# Patient Record
Sex: Male | Born: 1945 | Race: White | Hispanic: No | Marital: Married | Smoking: Never smoker
Health system: Southern US, Community
[De-identification: ages and names within clinical notes are randomized; demographics above are authoritative.]

## PROBLEM LIST (undated history)

## (undated) DIAGNOSIS — G5600 Carpal tunnel syndrome, unspecified upper limb: Secondary | ICD-10-CM

## (undated) DIAGNOSIS — H9193 Unspecified hearing loss, bilateral: Secondary | ICD-10-CM

## (undated) DIAGNOSIS — R7303 Prediabetes: Secondary | ICD-10-CM

## (undated) DIAGNOSIS — R350 Frequency of micturition: Secondary | ICD-10-CM

## (undated) DIAGNOSIS — Z8619 Personal history of other infectious and parasitic diseases: Secondary | ICD-10-CM

## (undated) DIAGNOSIS — N400 Enlarged prostate without lower urinary tract symptoms: Secondary | ICD-10-CM

## (undated) DIAGNOSIS — I10 Essential (primary) hypertension: Secondary | ICD-10-CM

## (undated) DIAGNOSIS — M1990 Unspecified osteoarthritis, unspecified site: Secondary | ICD-10-CM

## (undated) DIAGNOSIS — Z8719 Personal history of other diseases of the digestive system: Secondary | ICD-10-CM

## (undated) DIAGNOSIS — N059 Unspecified nephritic syndrome with unspecified morphologic changes: Secondary | ICD-10-CM

## (undated) DIAGNOSIS — R351 Nocturia: Secondary | ICD-10-CM

## (undated) DIAGNOSIS — Z8709 Personal history of other diseases of the respiratory system: Secondary | ICD-10-CM

## (undated) DIAGNOSIS — N200 Calculus of kidney: Secondary | ICD-10-CM

## (undated) DIAGNOSIS — K219 Gastro-esophageal reflux disease without esophagitis: Secondary | ICD-10-CM

## (undated) HISTORY — DX: Prediabetes: R73.03

## (undated) HISTORY — DX: Unspecified hearing loss, bilateral: H91.93

## (undated) HISTORY — PX: OTHER SURGICAL HISTORY: SHX169

## (undated) HISTORY — PX: TONSILLECTOMY: SUR1361

## (undated) HISTORY — DX: Essential (primary) hypertension: I10

## (undated) HISTORY — PX: PROSTATE BIOPSY: SHX241

## (undated) HISTORY — DX: Calculus of kidney: N20.0

## (undated) HISTORY — DX: Benign prostatic hyperplasia without lower urinary tract symptoms: N40.0

---

## 2013-07-22 HISTORY — PX: HIP SURGERY: SHX245

## 2013-12-29 DIAGNOSIS — I10 Essential (primary) hypertension: Secondary | ICD-10-CM | POA: Diagnosis not present

## 2013-12-29 DIAGNOSIS — K219 Gastro-esophageal reflux disease without esophagitis: Secondary | ICD-10-CM | POA: Diagnosis not present

## 2013-12-29 DIAGNOSIS — E119 Type 2 diabetes mellitus without complications: Secondary | ICD-10-CM | POA: Diagnosis not present

## 2013-12-29 DIAGNOSIS — M109 Gout, unspecified: Secondary | ICD-10-CM | POA: Diagnosis not present

## 2013-12-29 DIAGNOSIS — E7800 Pure hypercholesterolemia, unspecified: Secondary | ICD-10-CM | POA: Diagnosis not present

## 2013-12-29 DIAGNOSIS — N200 Calculus of kidney: Secondary | ICD-10-CM | POA: Diagnosis not present

## 2014-03-08 DIAGNOSIS — N200 Calculus of kidney: Secondary | ICD-10-CM | POA: Diagnosis not present

## 2014-03-08 DIAGNOSIS — N402 Nodular prostate without lower urinary tract symptoms: Secondary | ICD-10-CM | POA: Diagnosis not present

## 2014-03-08 DIAGNOSIS — R972 Elevated prostate specific antigen [PSA]: Secondary | ICD-10-CM | POA: Diagnosis not present

## 2014-03-08 DIAGNOSIS — N201 Calculus of ureter: Secondary | ICD-10-CM | POA: Diagnosis not present

## 2014-03-08 DIAGNOSIS — N401 Enlarged prostate with lower urinary tract symptoms: Secondary | ICD-10-CM | POA: Diagnosis not present

## 2014-03-24 DIAGNOSIS — M1610 Unilateral primary osteoarthritis, unspecified hip: Secondary | ICD-10-CM | POA: Diagnosis not present

## 2014-03-24 DIAGNOSIS — M25559 Pain in unspecified hip: Secondary | ICD-10-CM | POA: Diagnosis not present

## 2014-03-24 DIAGNOSIS — Z96649 Presence of unspecified artificial hip joint: Secondary | ICD-10-CM | POA: Diagnosis not present

## 2014-03-25 DIAGNOSIS — N402 Nodular prostate without lower urinary tract symptoms: Secondary | ICD-10-CM | POA: Diagnosis not present

## 2014-03-25 DIAGNOSIS — N201 Calculus of ureter: Secondary | ICD-10-CM | POA: Diagnosis not present

## 2014-03-25 DIAGNOSIS — N401 Enlarged prostate with lower urinary tract symptoms: Secondary | ICD-10-CM | POA: Diagnosis not present

## 2014-03-25 DIAGNOSIS — N138 Other obstructive and reflux uropathy: Secondary | ICD-10-CM | POA: Diagnosis not present

## 2014-03-25 DIAGNOSIS — N200 Calculus of kidney: Secondary | ICD-10-CM | POA: Diagnosis not present

## 2014-04-21 DIAGNOSIS — N138 Other obstructive and reflux uropathy: Secondary | ICD-10-CM | POA: Diagnosis not present

## 2014-04-21 DIAGNOSIS — N200 Calculus of kidney: Secondary | ICD-10-CM | POA: Diagnosis not present

## 2014-04-21 DIAGNOSIS — N401 Enlarged prostate with lower urinary tract symptoms: Secondary | ICD-10-CM | POA: Diagnosis not present

## 2014-04-22 DIAGNOSIS — N402 Nodular prostate without lower urinary tract symptoms: Secondary | ICD-10-CM | POA: Diagnosis not present

## 2014-04-22 DIAGNOSIS — N200 Calculus of kidney: Secondary | ICD-10-CM | POA: Diagnosis not present

## 2014-04-22 DIAGNOSIS — N401 Enlarged prostate with lower urinary tract symptoms: Secondary | ICD-10-CM | POA: Diagnosis not present

## 2014-05-24 DIAGNOSIS — N200 Calculus of kidney: Secondary | ICD-10-CM | POA: Diagnosis not present

## 2014-05-24 DIAGNOSIS — N201 Calculus of ureter: Secondary | ICD-10-CM | POA: Diagnosis not present

## 2014-05-24 DIAGNOSIS — N402 Nodular prostate without lower urinary tract symptoms: Secondary | ICD-10-CM | POA: Diagnosis not present

## 2014-05-24 DIAGNOSIS — N401 Enlarged prostate with lower urinary tract symptoms: Secondary | ICD-10-CM | POA: Diagnosis not present

## 2014-06-08 DIAGNOSIS — N402 Nodular prostate without lower urinary tract symptoms: Secondary | ICD-10-CM | POA: Diagnosis not present

## 2014-06-08 DIAGNOSIS — R3129 Other microscopic hematuria: Secondary | ICD-10-CM | POA: Diagnosis not present

## 2014-06-08 DIAGNOSIS — N401 Enlarged prostate with lower urinary tract symptoms: Secondary | ICD-10-CM | POA: Diagnosis not present

## 2014-06-08 DIAGNOSIS — N138 Other obstructive and reflux uropathy: Secondary | ICD-10-CM | POA: Diagnosis not present

## 2014-06-08 DIAGNOSIS — N200 Calculus of kidney: Secondary | ICD-10-CM | POA: Diagnosis not present

## 2014-07-29 DIAGNOSIS — N200 Calculus of kidney: Secondary | ICD-10-CM | POA: Diagnosis not present

## 2014-08-04 DIAGNOSIS — E119 Type 2 diabetes mellitus without complications: Secondary | ICD-10-CM | POA: Diagnosis not present

## 2014-08-04 DIAGNOSIS — Z23 Encounter for immunization: Secondary | ICD-10-CM | POA: Diagnosis not present

## 2014-08-04 DIAGNOSIS — D649 Anemia, unspecified: Secondary | ICD-10-CM | POA: Diagnosis not present

## 2014-08-04 DIAGNOSIS — I10 Essential (primary) hypertension: Secondary | ICD-10-CM | POA: Diagnosis not present

## 2014-08-04 DIAGNOSIS — E782 Mixed hyperlipidemia: Secondary | ICD-10-CM | POA: Diagnosis not present

## 2014-08-04 DIAGNOSIS — E780 Pure hypercholesterolemia: Secondary | ICD-10-CM | POA: Diagnosis not present

## 2015-01-19 DIAGNOSIS — E119 Type 2 diabetes mellitus without complications: Secondary | ICD-10-CM | POA: Diagnosis not present

## 2015-01-19 DIAGNOSIS — I10 Essential (primary) hypertension: Secondary | ICD-10-CM | POA: Diagnosis not present

## 2015-01-19 DIAGNOSIS — D649 Anemia, unspecified: Secondary | ICD-10-CM | POA: Diagnosis not present

## 2015-01-19 DIAGNOSIS — E782 Mixed hyperlipidemia: Secondary | ICD-10-CM | POA: Diagnosis not present

## 2015-03-02 DIAGNOSIS — Z471 Aftercare following joint replacement surgery: Secondary | ICD-10-CM | POA: Diagnosis not present

## 2015-03-02 DIAGNOSIS — Z96642 Presence of left artificial hip joint: Secondary | ICD-10-CM | POA: Diagnosis not present

## 2015-03-02 DIAGNOSIS — M1611 Unilateral primary osteoarthritis, right hip: Secondary | ICD-10-CM | POA: Diagnosis not present

## 2015-04-12 DIAGNOSIS — M1990 Unspecified osteoarthritis, unspecified site: Secondary | ICD-10-CM | POA: Diagnosis not present

## 2015-04-12 DIAGNOSIS — I10 Essential (primary) hypertension: Secondary | ICD-10-CM | POA: Diagnosis not present

## 2015-04-12 DIAGNOSIS — K219 Gastro-esophageal reflux disease without esophagitis: Secondary | ICD-10-CM | POA: Diagnosis not present

## 2015-04-12 DIAGNOSIS — M109 Gout, unspecified: Secondary | ICD-10-CM | POA: Diagnosis not present

## 2015-04-12 DIAGNOSIS — Z96642 Presence of left artificial hip joint: Secondary | ICD-10-CM | POA: Diagnosis not present

## 2015-04-12 DIAGNOSIS — N400 Enlarged prostate without lower urinary tract symptoms: Secondary | ICD-10-CM | POA: Diagnosis not present

## 2015-04-12 DIAGNOSIS — R9431 Abnormal electrocardiogram [ECG] [EKG]: Secondary | ICD-10-CM | POA: Diagnosis not present

## 2015-04-12 DIAGNOSIS — G8929 Other chronic pain: Secondary | ICD-10-CM | POA: Diagnosis not present

## 2015-04-12 DIAGNOSIS — E119 Type 2 diabetes mellitus without complications: Secondary | ICD-10-CM | POA: Diagnosis not present

## 2015-04-12 DIAGNOSIS — N200 Calculus of kidney: Secondary | ICD-10-CM | POA: Diagnosis not present

## 2015-04-12 DIAGNOSIS — Z01818 Encounter for other preprocedural examination: Secondary | ICD-10-CM | POA: Diagnosis not present

## 2015-04-12 DIAGNOSIS — E785 Hyperlipidemia, unspecified: Secondary | ICD-10-CM | POA: Diagnosis not present

## 2015-04-13 ENCOUNTER — Ambulatory Visit (INDEPENDENT_AMBULATORY_CARE_PROVIDER_SITE_OTHER): Payer: Medicare Other | Admitting: Internal Medicine

## 2015-04-13 ENCOUNTER — Encounter: Payer: Self-pay | Admitting: Internal Medicine

## 2015-04-13 VITALS — BP 144/86 | HR 83 | Ht 67.0 in | Wt 228.8 lb

## 2015-04-13 DIAGNOSIS — R9431 Abnormal electrocardiogram [ECG] [EKG]: Secondary | ICD-10-CM

## 2015-04-13 NOTE — Progress Notes (Signed)
Cardiology Office Note   Date:  04/13/2015   ID:  Corey Fuller, DOB [birth year], MRN [id number]  PCP:  Redmond Baseman, MD  Cardiologist:   Dietrich Pates, MD   No chief complaint on file.  Pt presents for preop evaluation     History of Present Illness: Corey Fuller is a 69 y.o. male who presents for preop evaluation for hip surgery  Hx abnormal EKG    Hx of HTN, HL  And DM  On Metformin for 2 years  "borderline"  Denies SOB  No CP    Activity limited some because of hip   Pain though is worse when supine  Pt vacuums  Walks up and down stairs. Mows yard  Walk mower    Hip surgery Sept 10    Current Outpatient Prescriptions  Medication Sig Dispense Refill  . allopurinol (ZYLOPRIM) 300 MG tablet Take 300 mg by mouth daily.    Marland Kitchen amLODipine (NORVASC) 5 MG tablet Take 5 mg by mouth daily.    Marland Kitchen aspirin 81 MG tablet Take 81 mg by mouth daily.    Marland Kitchen dutasteride (AVODART) 0.5 MG capsule Take 0.5 mg by mouth daily.    . fenofibrate (TRICOR) 48 MG tablet Take 48 mg by mouth daily.    . fluticasone (VERAMYST) 27.5 MCG/SPRAY nasal spray Place 2 sprays into the nose daily.    . Garlic 1000 MG CAPS Take 1,000 mg by mouth daily.    Marland Kitchen losartan-hydrochlorothiazide (HYZAAR) 100-12.5 MG per tablet Take 1 tablet by mouth daily.    . metFORMIN (GLUCOPHAGE) 500 MG tablet Take by mouth 2 (two) times daily with a meal.    . Multiple Vitamin (MULTIVITAMIN) tablet Take 1 tablet by mouth daily.    . naproxen (NAPROSYN) 375 MG tablet Take 375 mg by mouth 2 (two) times daily as needed for mild pain.    Marland Kitchen omeprazole (PRILOSEC) 40 MG capsule Take 40 mg by mouth daily.    . rosuvastatin (CRESTOR) 40 MG tablet Take 40 mg by mouth daily.    . tamsulosin (FLOMAX) 0.4 MG CAPS capsule Take 0.4 mg by mouth.    . vitamin E 400 UNIT capsule Take 400 Units by mouth daily.     No current facility-administered medications for this visit.    Allergies:   Review of patient's allergies indicates no known allergies.    Past Medical History  Diagnosis Date  . Hypertension   . Kidney stone   . Enlarged prostate   . Borderline diabetes   . Hearing difficulty of both ears     Past Surgical History  Procedure Laterality Date  . Hip surgery  07/22/2013    Left hip     Social History:  The patient  reports that he has never smoked. He does not have any smokeless tobacco history on file. He reports that he does not drink alcohol or use illicit drugs.   Family History:  The patient's family history includes Alzheimer's disease in his mother; Heart attack in his father.    ROS:  Please see the history of present illness. All other systems are reviewed and  Negative to the above problem except as noted.    PHYSICAL EXAM: VS:  BP 144/86 mmHg  Pulse 83  Ht  (1.702 m)  Wt 228 lb 12.8 oz (103.783 kg)  BMI 35.83 kg/m2  GEN: Well nourished, well developed, in no acute distress HEENT: normal Neck: no JVD, carotid bruits, or masses Cardiac: RRR; no  murmurs, rubs, or gallops,no edema  Respiratory:  clear to auscultation bilaterally, normal work of breathing GI: soft, nontender, nondistended, + BS  No hepatomegaly  MS: no deformity Moving all extremities   Skin: warm and dry, no rash Neuro:  Strength and sensation are intact Psych: euthymic mood, full affect   EKG:  EKG is ordered today.  SR 93 bpm  RBBB.  LAD     Lipid Panel No results found for: CHOL, TRIG, HDL, CHOLHDL, VLDL, LDLCALC, LDLDIRECT    Wt Readings from Last 3 Encounters:  04/13/15 228 lb 12.8 oz (103.783 kg)      ASSESSMENT AND PLAN:  1.  Preop evaluation.  Pt is active despite DJD  He denies CP Exam unremarkable  EKG with SR but RBBB. I would recomm echo to confirm LVEF  Otherwise, if normal I think pt is low risk for major cardiac event and OK to proceed with surgery  2.  HTN  BP is a little high today  I would not make any changes today   Current medicines are reviewed at length with the patient today.  The  patient does not have concerns regarding medicines.  No follow up sched  WIll depend on echo   Signed, Dietrich PatesPaula Perrion Diesel, MD  04/13/2015 9:35 AM    [facility] [address], [place], [place]  [address] Phone: [phone]) [phone]; Fax: [phone]) [phone]

## 2015-04-13 NOTE — Patient Instructions (Addendum)
Your physician recommends that you continue on your current medications as directed. Please refer to the Current Medication list given to you today.  Your physician has requested that you have an echocardiogram. Echocardiography is a painless test that uses sound waves to create images of your heart. It provides your doctor with information about the size and shape of your heart and how well your heart's chambers and valves are working. This procedure takes approximately one hour. There are no restrictions for this procedure.  Your physician wants you to follow-up in: 1 YEAR WITH DR. Tenny CrawOSS.  You will receive a reminder letter in the mail two months in advance. If you don't receive a letter, please call our office to schedule the follow-up appointment.

## 2015-04-19 ENCOUNTER — Ambulatory Visit (HOSPITAL_COMMUNITY): Payer: Medicare Other | Attending: Internal Medicine

## 2015-04-19 ENCOUNTER — Other Ambulatory Visit: Payer: Self-pay

## 2015-04-19 ENCOUNTER — Telehealth: Payer: Self-pay | Admitting: Internal Medicine

## 2015-04-19 DIAGNOSIS — E669 Obesity, unspecified: Secondary | ICD-10-CM | POA: Diagnosis not present

## 2015-04-19 DIAGNOSIS — Z6835 Body mass index (BMI) 35.0-35.9, adult: Secondary | ICD-10-CM | POA: Insufficient documentation

## 2015-04-19 DIAGNOSIS — E119 Type 2 diabetes mellitus without complications: Secondary | ICD-10-CM | POA: Insufficient documentation

## 2015-04-19 DIAGNOSIS — E785 Hyperlipidemia, unspecified: Secondary | ICD-10-CM | POA: Insufficient documentation

## 2015-04-19 DIAGNOSIS — I10 Essential (primary) hypertension: Secondary | ICD-10-CM | POA: Insufficient documentation

## 2015-04-19 DIAGNOSIS — R9431 Abnormal electrocardiogram [ECG] [EKG]: Secondary | ICD-10-CM | POA: Diagnosis not present

## 2015-04-19 MED ORDER — PERFLUTREN LIPID MICROSPHERE
3.0000 mL | Freq: Once | INTRAVENOUS | Status: AC
  Administered 2015-04-19: 3 mL via INTRAVENOUS

## 2015-04-19 NOTE — Telephone Encounter (Signed)
Walk in pt. Form.,Medication is incorrect. [facility]

## 2015-04-21 ENCOUNTER — Telehealth: Payer: Self-pay | Admitting: *Deleted

## 2015-04-21 NOTE — Telephone Encounter (Signed)
Pt walked in with medication list, requesting we change how we have his metformin listed. He takes metformin 500 mg once daily Med list adjusted.

## 2015-06-13 ENCOUNTER — Ambulatory Visit: Payer: Self-pay | Admitting: Orthopedic Surgery

## 2015-06-13 NOTE — Progress Notes (Signed)
Preoperative surgical orders have been place into the Epic hospital system for [facility] on 06/13/2015, 7:34 AM  by Patrica Duel for surgery on 06-26-2015.  Preop Total Hip - Anterior Approach orders including IV Tylenol, and IV Decadron as long as there are no contraindications to the above medications. Avel Peace, PA-C

## 2015-06-19 ENCOUNTER — Encounter (HOSPITAL_COMMUNITY)
Admission: RE | Admit: 2015-06-19 | Discharge: 2015-06-19 | Disposition: A | Discharge status: Home / Self Care | Payer: Medicare Other | Source: Ambulatory Visit | Attending: Orthopedic Surgery | Admitting: Orthopedic Surgery

## 2015-06-19 ENCOUNTER — Encounter (INDEPENDENT_AMBULATORY_CARE_PROVIDER_SITE_OTHER): Payer: Self-pay

## 2015-06-19 ENCOUNTER — Encounter (HOSPITAL_COMMUNITY): Payer: Self-pay

## 2015-06-19 DIAGNOSIS — Z01818 Encounter for other preprocedural examination: Secondary | ICD-10-CM | POA: Diagnosis not present

## 2015-06-19 DIAGNOSIS — M1611 Unilateral primary osteoarthritis, right hip: Secondary | ICD-10-CM | POA: Insufficient documentation

## 2015-06-19 HISTORY — DX: Unspecified osteoarthritis, unspecified site: M19.90

## 2015-06-19 HISTORY — DX: Personal history of other infectious and parasitic diseases: Z86.19

## 2015-06-19 HISTORY — DX: Nocturia: R35.1

## 2015-06-19 HISTORY — DX: Personal history of other diseases of the digestive system: Z87.19

## 2015-06-19 HISTORY — DX: Gastro-esophageal reflux disease without esophagitis: K21.9

## 2015-06-19 HISTORY — DX: Frequency of micturition: R35.0

## 2015-06-19 HISTORY — DX: Unspecified nephritic syndrome with unspecified morphologic changes: N05.9

## 2015-06-19 HISTORY — DX: Personal history of other diseases of the respiratory system: Z87.09

## 2015-06-19 HISTORY — DX: Carpal tunnel syndrome, unspecified upper limb: G56.00

## 2015-06-19 LAB — COMPREHENSIVE METABOLIC PANEL
ALBUMIN: 4.5 g/dL (ref 3.5–5.0)
ALT: 20 U/L (ref 17–63)
ANION GAP: 10 (ref 5–15)
AST: 28 U/L (ref 15–41)
Alkaline Phosphatase: 62 U/L (ref 38–126)
BUN: 18 mg/dL (ref 6–20)
CHLORIDE: 103 mmol/L (ref 101–111)
CO2: 28 mmol/L (ref 22–32)
Calcium: 10.1 mg/dL (ref 8.9–10.3)
Creatinine, Ser: 1.3 mg/dL — ABNORMAL HIGH (ref 0.61–1.24)
GFR calc non Af Amer: 54 mL/min — ABNORMAL LOW (ref >60)
Glucose, Bld: 98 mg/dL (ref 65–99)
Potassium: 3.8 mmol/L (ref 3.5–5.1)
SODIUM: 141 mmol/L (ref 135–145)
Total Bilirubin: 0.7 mg/dL (ref 0.3–1.2)
Total Protein: 7.6 g/dL (ref 6.5–8.1)

## 2015-06-19 LAB — URINALYSIS, ROUTINE W REFLEX MICROSCOPIC
BILIRUBIN URINE: NEGATIVE
GLUCOSE, UA: NEGATIVE mg/dL
HGB URINE DIPSTICK: NEGATIVE
KETONES UR: NEGATIVE mg/dL
Nitrite: NEGATIVE
PROTEIN: NEGATIVE mg/dL
Specific Gravity, Urine: 1.02 (ref 1.005–1.030)
Urobilinogen, UA: 0.2 mg/dL (ref 0.0–1.0)
pH: 6.5 (ref 5.0–8.0)

## 2015-06-19 LAB — SURGICAL PCR SCREEN
MRSA, PCR: NEGATIVE
STAPHYLOCOCCUS AUREUS: NEGATIVE

## 2015-06-19 LAB — URINE MICROSCOPIC-ADD ON

## 2015-06-19 LAB — ABO/RH: ABO/RH(D): O POS

## 2015-06-19 LAB — CBC
HCT: 47.3 % (ref 39.0–52.0)
Hemoglobin: 15.7 g/dL (ref 13.0–17.0)
MCH: 31 pg (ref 26.0–34.0)
MCHC: 33.2 g/dL (ref 30.0–36.0)
MCV: 93.5 fL (ref 78.0–100.0)
PLATELETS: 235 10*3/uL (ref 150–400)
RBC: 5.06 MIL/uL (ref 4.22–5.81)
RDW: 13.6 % (ref 11.5–15.5)
WBC: 6.6 10*3/uL (ref 4.0–10.5)

## 2015-06-19 LAB — APTT: APTT: 32 s (ref 24–37)

## 2015-06-19 LAB — PROTIME-INR
INR: 1.06 (ref 0.00–1.49)
Prothrombin Time: 14 seconds (ref 11.6–15.2)

## 2015-06-19 NOTE — Progress Notes (Signed)
Your patient has screened at an elevated risk for Obstructive Sleep Apnea using the Stop-Bang Tool during a pre-surgical visit. Pt demonstrated high risk. Score of 6.

## 2015-06-19 NOTE — Progress Notes (Signed)
Pt notified of surgical time change. Pt aware to disregard clear liquid diet instructions given during PAT visit 06/19/2015. Pt aware to not eat any food or drink any liquids after midnight. Pt aware to arrive at short stay at 9:40 am.

## 2015-06-19 NOTE — Progress Notes (Signed)
Note per chart per Dr Modesto Charon 04/12/2015  EKG epic 04/13/2015 ECHO epic 04/19/2015 noting clearance per Dr Tenny Craw / cardiology  Dr Clarise Cruz notes / epic 04/13/2015 and 04/19/2015

## 2015-06-19 NOTE — Patient Instructions (Signed)
Corey Fuller  06/19/2015   Your procedure is scheduled on: Monday June 26, 2015   Report to [facility] Main  Entrance take [place] [facility]  elevators to 3rd floor to  Short Stay Center at 10:40 AM.  Call this number if you have problems the morning of surgery [phone]   Remember: ONLY 1 PERSON MAY GO WITH YOU TO SHORT STAY TO GET  READY MORNING OF YOUR SURGERY.  Do not eat food After Midnight but may take clear liquids till 7:30 am day of surgery then nothing by mouth.      Take these medicines the morning of surgery with A SIP OF WATER: Allopurinol; Amlodipine (Norvasc); Omeprazole (Prilosec); Oxycodone-Acetaminophen if needed                               You may not have any metal on your body including hair pins and              piercings  Do not wear jewelry, lotions, powders or colognes, deodorant                           Men may shave face and neck.   Do not bring valuables to the hospital. [facility] IS NOT             RESPONSIBLE   FOR VALUABLES.  Contacts, dentures or bridgework may not be worn into surgery.  Leave suitcase in the car. After surgery it may be brought to your room.                Please read over the following fact sheets you were given:MRSA INFORMATION SHEET; INCENTIVE SPIROMETER; BLOOD TRANSFUSION INFORMATION SHEET _____________________________________________________________________             [facility] - Preparing for Surgery Before surgery, you can play an important role.  Because skin is not sterile, your skin needs to be as free of germs as possible.  You can reduce the number of germs on your skin by washing with CHG (chlorahexidine gluconate) soap before surgery.  CHG is an antiseptic cleaner which kills germs and bonds with the skin to continue killing germs even after washing. Please DO NOT use if you have an allergy to CHG or antibacterial soaps.  If your skin becomes reddened/irritated stop using the CHG and inform your  nurse when you arrive at Short Stay. Do not shave (including legs and underarms) for at least 48 hours prior to the first CHG shower.  You may shave your face/neck. Please follow these instructions carefully:  1.  Shower with CHG Soap the night before surgery and the  morning of Surgery.  2.  If you choose to wash your hair, wash your hair first as usual with your  normal  shampoo.  3.  After you shampoo, rinse your hair and body thoroughly to remove the  shampoo.                           4.  Use CHG as you would any other liquid soap.  You can apply chg directly  to the skin and wash                       Gently with a  scrungie or clean washcloth.  5.  Apply the CHG Soap to your body ONLY FROM THE NECK DOWN.   Do not use on face/ open                           Wound or open sores. Avoid contact with eyes, ears mouth and genitals (private parts).                       Wash face,  Genitals (private parts) with your normal soap.             6.  Wash thoroughly, paying special attention to the area where your surgery  will be performed.  7.  Thoroughly rinse your body with warm water from the neck down.  8.  DO NOT shower/wash with your normal soap after using and rinsing off  the CHG Soap.                9.  Pat yourself dry with a clean towel.            10.  Wear clean pajamas.            11.  Place clean sheets on your bed the night of your first shower and do not  sleep with pets. Day of Surgery : Do not apply any lotions/deodorants the morning of surgery.  Please wear clean clothes to the hospital/surgery center.  FAILURE TO FOLLOW THESE INSTRUCTIONS MAY RESULT IN THE CANCELLATION OF YOUR SURGERY PATIENT SIGNATURE_________________________________  NURSE SIGNATURE__________________________________  ________________________________________________________________________    CLEAR LIQUID DIET   Foods Allowed                                                                     Foods  Excluded  Coffee and tea, regular and decaf                             liquids that you cannot  Plain Jell-O in any flavor                                             see through such as: Fruit ices (not with fruit pulp)                                     milk, soups, orange juice  Iced Popsicles                                    All solid food Carbonated beverages, regular and diet                                    Cranberry, grape and apple juices Sports drinks like Gatorade Lightly seasoned clear broth or consume(fat free) Sugar, honey syrup  Sample Menu Breakfast  Lunch                                     Supper Cranberry juice                    Beef broth                            Chicken broth Jell-O                                     Grape juice                           Apple juice Coffee or tea                        Jell-O                                      Popsicle                                                Coffee or tea                        Coffee or tea  _____________________________________________________________________    Incentive Spirometer  An incentive spirometer is a tool that can help keep your lungs clear and active. This tool measures how well you are filling your lungs with each breath. Taking long deep breaths may help reverse or decrease the chance of developing breathing (pulmonary) problems (especially infection) following:  A long period of time when you are unable to move or be active. BEFORE THE PROCEDURE   If the spirometer includes an indicator to show your best effort, your nurse or respiratory therapist will set it to a desired goal.  If possible, sit up straight or lean slightly forward. Try not to slouch.  Hold the incentive spirometer in an upright position. INSTRUCTIONS FOR USE   Sit on the edge of your bed if possible, or sit up as far as you can in bed or on a chair.  Hold the incentive  spirometer in an upright position.  Breathe out normally.  Place the mouthpiece in your mouth and seal your lips tightly around it.  Breathe in slowly and as deeply as possible, raising the piston or the ball toward the top of the column.  Hold your breath for 3-5 seconds or for as long as possible. Allow the piston or ball to fall to the bottom of the column.  Remove the mouthpiece from your mouth and breathe out normally.  Rest for a few seconds and repeat Steps 1 through 7 at least 10 times every 1-2 hours when you are awake. Take your time and take a few normal breaths between deep breaths.  The spirometer may include an indicator to show your best effort. Use the indicator as a goal to work toward during each repetition.  After each set of 10 deep breaths,  practice coughing to be sure your lungs are clear. If you have an incision (the cut made at the time of surgery), support your incision when coughing by placing a pillow or rolled up towels firmly against it. Once you are able to get out of bed, walk around indoors and cough well. You may stop using the incentive spirometer when instructed by your caregiver.  RISKS AND COMPLICATIONS  Take your time so you do not get dizzy or light-headed.  If you are in pain, you may need to take or ask for pain medication before doing incentive spirometry. It is harder to take a deep breath if you are having pain. AFTER USE  Rest and breathe slowly and easily.  It can be helpful to keep track of a log of your progress. Your caregiver can provide you with a simple table to help with this. If you are using the spirometer at home, follow these instructions: SEEK MEDICAL CARE IF:   You are having difficultly using the spirometer.  You have trouble using the spirometer as often as instructed.  Your pain medication is not giving enough relief while using the spirometer.  You develop fever of 100.5 F (38.1 C) or higher. SEEK IMMEDIATE MEDICAL  CARE IF:   You cough up bloody sputum that had not been present before.  You develop fever of 102 F (38.9 C) or greater.  You develop worsening pain at or near the incision site. MAKE SURE YOU:   Understand these instructions.  Will watch your condition.  Will get help right away if you are not doing well or get worse. Document Released: 02/03/2007 Document Revised: 12/16/2011 Document Reviewed: 04/06/2007 ExitCare Patient Information 2014 ExitCare, [place].   ________________________________________________________________________  WHAT IS A BLOOD TRANSFUSION? Blood Transfusion Information  A transfusion is the replacement of blood or some of its parts. Blood is made up of multiple cells which provide different functions.  Red blood cells carry oxygen and are used for blood loss replacement.  White blood cells fight against infection.  Platelets control bleeding.  Plasma helps clot blood.  Other blood products are available for specialized needs, such as hemophilia or other clotting disorders. BEFORE THE TRANSFUSION  Who gives blood for transfusions?   Healthy volunteers who are fully evaluated to make sure their blood is safe. This is blood bank blood. Transfusion therapy is the safest it has ever been in the practice of medicine. Before blood is taken from a donor, a complete history is taken to make sure that person has no history of diseases nor engages in risky social behavior (examples are intravenous drug use or sexual activity with multiple partners). The donor's travel history is screened to minimize risk of transmitting infections, such as malaria. The donated blood is tested for signs of infectious diseases, such as HIV and hepatitis. The blood is then tested to be sure it is compatible with you in order to minimize the chance of a transfusion reaction. If you or a relative donates blood, this is often done in anticipation of surgery and is not appropriate for  emergency situations. It takes many days to process the donated blood. RISKS AND COMPLICATIONS Although transfusion therapy is very safe and saves many lives, the main dangers of transfusion include:   Getting an infectious disease.  Developing a transfusion reaction. This is an allergic reaction to something in the blood you were given. Every precaution is taken to prevent this. The decision to have a blood transfusion has been  considered carefully by your caregiver before blood is given. Blood is not given unless the benefits outweigh the risks. AFTER THE TRANSFUSION  Right after receiving a blood transfusion, you will usually feel much better and more energetic. This is especially true if your red blood cells have gotten low (anemic). The transfusion raises the level of the red blood cells which carry oxygen, and this usually causes an energy increase.  The nurse administering the transfusion will monitor you carefully for complications. HOME CARE INSTRUCTIONS  No special instructions are needed after a transfusion. You may find your energy is better. Speak with your caregiver about any limitations on activity for underlying diseases you may have. SEEK MEDICAL CARE IF:   Your condition is not improving after your transfusion.  You develop redness or irritation at the intravenous (IV) site. SEEK IMMEDIATE MEDICAL CARE IF:  Any of the following symptoms occur over the next 12 hours:  Shaking chills.  You have a temperature by mouth above 102 F (38.9 C), not controlled by medicine.  Chest, back, or muscle pain.  People around you feel you are not acting correctly or are confused.  Shortness of breath or difficulty breathing.  Dizziness and fainting.  You get a rash or develop hives.  You have a decrease in urine output.  Your urine turns a dark color or changes to pink, red, or brown. Any of the following symptoms occur over the next 10 days:  You have a temperature by  mouth above 102 F (38.9 C), not controlled by medicine.  Shortness of breath.  Weakness after normal activity.  The white part of the eye turns yellow (jaundice).  You have a decrease in the amount of urine or are urinating less often.  Your urine turns a dark color or changes to pink, red, or brown. Document Released: 09/20/2000 Document Revised: 12/16/2011 Document Reviewed: 05/09/2008 [facility] Patient Information 2014 [place], [place].  _______________________________________________________________________

## 2015-06-25 NOTE — H&P (Signed)
TOTAL HIP ADMISSION H&P  Patient is admitted for right total hip arthroplasty.  Subjective:  Chief Complaint: right hip pain  HPI: Corey Fuller, 69 y.o. male, has a history of pain and functional disability in the right hip(s) due to arthritis and patient has failed non-surgical conservative treatments for greater than 12 weeks to include NSAID's and/or analgesics, corticosteriod injections and activity modification.  Onset of symptoms was gradual starting 3 years ago with gradually worsening course since that time.The patient noted no past surgery on the right hip(s).  Patient currently rates pain in the right hip at 7 out of 10 with activity. Patient has night pain, worsening of pain with activity and weight bearing, pain that interfers with activities of daily living and pain with passive range of motion. Patient has evidence of subchondral cysts, subchondral sclerosis, periarticular osteophytes and joint space narrowing by imaging studies. This condition presents safety issues increasing the risk of falls.  There is no current active infection.   Past Medical History  Diagnosis Date  . Hypertension   . Kidney stone   . Enlarged prostate   . Borderline diabetes   . Hearing difficulty of both ears     can not hear high freq  . History of bronchitis     3 to 4 years ago   . Urinary frequency   . Nocturia   . GERD (gastroesophageal reflux disease)   . History of hiatal hernia   . Arthritis   . Carpal tunnel syndrome     bilat   . History of shingles     2 years ago   . Bright's disease     Past Surgical History  Procedure Laterality Date  . Hip surgery  07/22/2013    Left hip  . Tonsillectomy    . Cyst removed       on spine at age 57   . Prostate biopsy      times 3      Current outpatient prescriptions:  .  allopurinol (ZYLOPRIM) 300 MG tablet, Take 300 mg by mouth daily., Disp: , Rfl:  .  amLODipine (NORVASC) 5 MG tablet, Take 5 mg by mouth every morning. , Disp: ,  Rfl:  .  aspirin 81 MG tablet, Take 81 mg by mouth at bedtime. , Disp: , Rfl:  .  dutasteride (AVODART) 0.5 MG capsule, Take 0.5 mg by mouth daily., Disp: , Rfl:  .  fenofibrate (TRICOR) 48 MG tablet, Take 48 mg by mouth daily., Disp: , Rfl:  .  fluticasone (VERAMYST) 27.5 MCG/SPRAY nasal spray, Place 2 sprays into the nose at bedtime. , Disp: , Rfl:  .  Garlic 1000 MG CAPS, Take 1,000 mg by mouth daily., Disp: , Rfl:  .  losartan-hydrochlorothiazide (HYZAAR) 100-12.5 MG per tablet, Take 1 tablet by mouth every morning. , Disp: , Rfl:  .  metFORMIN (GLUCOPHAGE) 500 MG tablet, Take by mouth daily with breakfast. , Disp: , Rfl:  .  Multiple Vitamin (MULTIVITAMIN) tablet, Take 1 tablet by mouth daily., Disp: , Rfl:  .  naproxen (NAPROSYN) 375 MG tablet, Take 375 mg by mouth 2 (two) times daily as needed for mild pain., Disp: , Rfl:  .  omeprazole (PRILOSEC) 40 MG capsule, Take 40 mg by mouth daily., Disp: , Rfl:  .  oxyCODONE-acetaminophen (PERCOCET/ROXICET) 5-325 MG per tablet, Take 1 tablet by mouth 2 (two) times daily as needed for moderate pain or severe pain., Disp: , Rfl:  .  rosuvastatin (CRESTOR) 40  MG tablet, Take 40 mg by mouth every evening. , Disp: , Rfl:  .  tamsulosin (FLOMAX) 0.4 MG CAPS capsule, Take 0.4 mg by mouth every evening. , Disp: , Rfl:  .  vitamin E 400 UNIT capsule, Take 400 Units by mouth daily., Disp: , Rfl:   No Known Allergies  Social History  Substance Use Topics  . Smoking status: Never Smoker   . Smokeless tobacco: Never Used  . Alcohol Use: No    Family History  Problem Relation Age of Onset  . Alzheimer's disease Mother   . Heart attack Father      Review of Systems  Constitutional: Negative.   HENT: Positive for hearing loss. Negative for congestion, ear discharge, ear pain, nosebleeds, sore throat and tinnitus.   Eyes: Negative.   Respiratory: Negative.  Negative for stridor.   Cardiovascular: Negative.   Gastrointestinal: Negative.    Genitourinary: Positive for frequency. Negative for dysuria, urgency, hematuria and flank pain.  Musculoskeletal: Positive for joint pain. Negative for myalgias, back pain, falls and neck pain.       Right hip pain  Skin: Negative.   Neurological: Negative.  Negative for headaches.  Endo/Heme/Allergies: Negative.   Psychiatric/Behavioral: Negative.     Objective:  Physical Exam  Constitutional: He is oriented to person, place, and time. He appears well-developed. No distress.  Obese  HENT:  Head: Normocephalic and atraumatic.  Right Ear: External ear normal.  Left Ear: External ear normal.  Nose: Nose normal.  Mouth/Throat: Oropharynx is clear and moist.  Eyes: Conjunctivae and EOM are normal.  Neck: Normal range of motion. Neck supple.  Cardiovascular: Normal rate, regular rhythm, normal heart sounds and intact distal pulses.   No murmur heard. Respiratory: Effort normal and breath sounds normal. No respiratory distress. He has no wheezes.  GI: Soft. Bowel sounds are normal. He exhibits no distension. There is no tenderness.  Musculoskeletal:  Evaluation of his left hip, flexion 110, rotation 130, out forward abduction 40 without pain on range of motion. Right hip flexion 90. No internal rotation. About 5 degrees of external rotation, 5 to 10 degrees of abduction. His right knee exam is normal.  He has a significantly antalgic gait pattern on the right.  Neurological: He is alert and oriented to person, place, and time. He has normal strength and normal reflexes. No sensory deficit.  Skin: No rash noted. He is not diaphoretic. No erythema.  Psychiatric: He has a normal mood and affect. His behavior is normal.    Vitals  Weight: 229 lb Height: 66in Body Surface Area: 2.12 m Body Mass Index: 36.96 kg/m  Pulse: 88 (Regular)  BP: 130/84 (Sitting, Left Arm, Standard)  Imaging Review Plain radiographs demonstrate severe degenerative joint disease of the right hip(s).  The bone quality appears to be good for age and reported activity level.  Assessment/Plan:  End stage primary osteoarthritis, right hip(s)  The patient history, physical examination, clinical judgement of the provider and imaging studies are consistent with end stage degenerative joint disease of the right hip(s) and total hip arthroplasty is deemed medically necessary. The treatment options including medical management, injection therapy, arthroscopy and arthroplasty were discussed at length. The risks and benefits of total hip arthroplasty were presented and reviewed. The risks due to aseptic loosening, infection, stiffness, dislocation/subluxation,  thromboembolic complications and other imponderables were discussed.  The patient acknowledged the explanation, agreed to proceed with the plan and consent was signed. Patient is being admitted for inpatient treatment for  surgery, pain control, PT, OT, prophylactic antibiotics, VTE prophylaxis, progressive ambulation and ADL's and discharge planning.The patient is planning to be discharged  home with home health services     PCP:Dr. Modesto Charon Had appointment with Dr. Tenny Craw preop for abnormal EKG but echo was normal TXA IV   Dimitri Ped, PA-C

## 2015-06-26 ENCOUNTER — Inpatient Hospital Stay (HOSPITAL_COMMUNITY): Payer: Medicare Other | Admitting: Anesthesiology

## 2015-06-26 ENCOUNTER — Encounter (HOSPITAL_COMMUNITY)
Admission: RE | Disposition: A | Discharge status: Home Health | Payer: Self-pay | Source: Ambulatory Visit | Attending: Orthopedic Surgery

## 2015-06-26 ENCOUNTER — Inpatient Hospital Stay (HOSPITAL_COMMUNITY)
Admission: RE | Admit: 2015-06-26 | Discharge: 2015-06-27 | DRG: 470 | Disposition: A | Discharge status: Home Health | Payer: Medicare Other | Source: Ambulatory Visit | Attending: Orthopedic Surgery | Admitting: Orthopedic Surgery

## 2015-06-26 ENCOUNTER — Encounter (HOSPITAL_COMMUNITY): Payer: Self-pay | Admitting: *Deleted

## 2015-06-26 ENCOUNTER — Inpatient Hospital Stay (HOSPITAL_COMMUNITY): Payer: Medicare Other

## 2015-06-26 DIAGNOSIS — Z7982 Long term (current) use of aspirin: Secondary | ICD-10-CM | POA: Diagnosis not present

## 2015-06-26 DIAGNOSIS — H918X3 Other specified hearing loss, bilateral: Secondary | ICD-10-CM | POA: Diagnosis present

## 2015-06-26 DIAGNOSIS — Z8619 Personal history of other infectious and parasitic diseases: Secondary | ICD-10-CM | POA: Diagnosis not present

## 2015-06-26 DIAGNOSIS — M1611 Unilateral primary osteoarthritis, right hip: Secondary | ICD-10-CM | POA: Diagnosis not present

## 2015-06-26 DIAGNOSIS — M25551 Pain in right hip: Secondary | ICD-10-CM | POA: Diagnosis not present

## 2015-06-26 DIAGNOSIS — Z87442 Personal history of urinary calculi: Secondary | ICD-10-CM | POA: Diagnosis not present

## 2015-06-26 DIAGNOSIS — Z96649 Presence of unspecified artificial hip joint: Secondary | ICD-10-CM

## 2015-06-26 DIAGNOSIS — I10 Essential (primary) hypertension: Secondary | ICD-10-CM | POA: Diagnosis present

## 2015-06-26 DIAGNOSIS — Z01812 Encounter for preprocedural laboratory examination: Secondary | ICD-10-CM | POA: Diagnosis not present

## 2015-06-26 DIAGNOSIS — Z8249 Family history of ischemic heart disease and other diseases of the circulatory system: Secondary | ICD-10-CM

## 2015-06-26 DIAGNOSIS — N189 Chronic kidney disease, unspecified: Secondary | ICD-10-CM | POA: Diagnosis not present

## 2015-06-26 DIAGNOSIS — M169 Osteoarthritis of hip, unspecified: Secondary | ICD-10-CM | POA: Diagnosis present

## 2015-06-26 DIAGNOSIS — Z96642 Presence of left artificial hip joint: Secondary | ICD-10-CM | POA: Diagnosis present

## 2015-06-26 DIAGNOSIS — Z79899 Other long term (current) drug therapy: Secondary | ICD-10-CM | POA: Diagnosis not present

## 2015-06-26 DIAGNOSIS — Z471 Aftercare following joint replacement surgery: Secondary | ICD-10-CM | POA: Diagnosis not present

## 2015-06-26 DIAGNOSIS — K219 Gastro-esophageal reflux disease without esophagitis: Secondary | ICD-10-CM | POA: Diagnosis present

## 2015-06-26 DIAGNOSIS — Z96641 Presence of right artificial hip joint: Secondary | ICD-10-CM | POA: Diagnosis not present

## 2015-06-26 HISTORY — PX: TOTAL HIP ARTHROPLASTY: SHX124

## 2015-06-26 LAB — GLUCOSE, CAPILLARY
GLUCOSE-CAPILLARY: 96 mg/dL (ref 65–99)
GLUCOSE-CAPILLARY: 98 mg/dL (ref 65–99)
Glucose-Capillary: 223 mg/dL — ABNORMAL HIGH (ref 65–99)
Glucose-Capillary: 187 mg/dL — ABNORMAL HIGH (ref 65–99)

## 2015-06-26 LAB — TYPE AND SCREEN
ABO/RH(D): O POS
ANTIBODY SCREEN: NEGATIVE

## 2015-06-26 SURGERY — ARTHROPLASTY, HIP, TOTAL, ANTERIOR APPROACH
Anesthesia: Spinal | Site: Hip | Laterality: Right

## 2015-06-26 MED ORDER — BUPIVACAINE HCL (PF) 0.25 % IJ SOLN
INTRAMUSCULAR | Status: DC | PRN
  Administered 2015-06-26: 30 mL

## 2015-06-26 MED ORDER — RIVAROXABAN 10 MG PO TABS
10.0000 mg | ORAL_TABLET | Freq: Every day | ORAL | Status: DC
  Administered 2015-06-27: 10 mg via ORAL
  Filled 2015-06-26 (×2): qty 1

## 2015-06-26 MED ORDER — DIPHENHYDRAMINE HCL 12.5 MG/5ML PO ELIX: 12.5000 mg | ORAL_SOLUTION | ORAL | Status: DC | PRN

## 2015-06-26 MED ORDER — CEFAZOLIN SODIUM-DEXTROSE 2-3 GM-% IV SOLR
2.0000 g | Freq: Four times a day (QID) | INTRAVENOUS | Status: AC
  Administered 2015-06-26 (×2): 2 g via INTRAVENOUS
  Filled 2015-06-26 (×2): qty 50

## 2015-06-26 MED ORDER — PROPOFOL INFUSION 10 MG/ML OPTIME
INTRAVENOUS | Status: DC | PRN
  Administered 2015-06-26: 50 ug/kg/min via INTRAVENOUS

## 2015-06-26 MED ORDER — PROPOFOL 10 MG/ML IV BOLUS
INTRAVENOUS | Status: AC
  Filled 2015-06-26: qty 20

## 2015-06-26 MED ORDER — DUTASTERIDE 0.5 MG PO CAPS
0.5000 mg | ORAL_CAPSULE | Freq: Every day | ORAL | Status: DC
  Administered 2015-06-27: 0.5 mg via ORAL
  Filled 2015-06-26: qty 1

## 2015-06-26 MED ORDER — FLEET ENEMA 7-19 GM/118ML RE ENEM: 1.0000 | ENEMA | Freq: Once | RECTAL | Status: DC | PRN

## 2015-06-26 MED ORDER — TAMSULOSIN HCL 0.4 MG PO CAPS
0.4000 mg | ORAL_CAPSULE | Freq: Every evening | ORAL | Status: DC
  Administered 2015-06-26: 0.4 mg via ORAL
  Filled 2015-06-26 (×2): qty 1

## 2015-06-26 MED ORDER — CEFAZOLIN SODIUM-DEXTROSE 2-3 GM-% IV SOLR
2.0000 g | INTRAVENOUS | Status: AC
  Administered 2015-06-26: 2 g via INTRAVENOUS

## 2015-06-26 MED ORDER — DEXTROSE 5 % IV SOLN
10.0000 mg | INTRAVENOUS | Status: DC | PRN
  Administered 2015-06-26: 25 ug/min via INTRAVENOUS

## 2015-06-26 MED ORDER — INSULIN ASPART 100 UNIT/ML ~~LOC~~ SOLN
0.0000 [IU] | Freq: Three times a day (TID) | SUBCUTANEOUS | Status: DC
  Administered 2015-06-26: 5 [IU] via SUBCUTANEOUS

## 2015-06-26 MED ORDER — SODIUM CHLORIDE 0.9 % IV SOLN: INTRAVENOUS | Status: DC

## 2015-06-26 MED ORDER — FENTANYL CITRATE (PF) 100 MCG/2ML IJ SOLN
INTRAMUSCULAR | Status: DC | PRN
  Administered 2015-06-26: 50 ug via INTRAVENOUS

## 2015-06-26 MED ORDER — ONDANSETRON HCL 4 MG/2ML IJ SOLN: 4.0000 mg | Freq: Once | INTRAMUSCULAR | Status: DC | PRN

## 2015-06-26 MED ORDER — PANTOPRAZOLE SODIUM 40 MG PO TBEC
80.0000 mg | DELAYED_RELEASE_TABLET | Freq: Every day | ORAL | Status: DC
  Filled 2015-06-26: qty 2

## 2015-06-26 MED ORDER — MORPHINE SULFATE (PF) 2 MG/ML IV SOLN: 1.0000 mg | INTRAVENOUS | Status: DC | PRN

## 2015-06-26 MED ORDER — MIDAZOLAM HCL 2 MG/2ML IJ SOLN
INTRAMUSCULAR | Status: AC
  Filled 2015-06-26: qty 4

## 2015-06-26 MED ORDER — ONDANSETRON HCL 4 MG/2ML IJ SOLN
INTRAMUSCULAR | Status: AC
  Filled 2015-06-26: qty 2

## 2015-06-26 MED ORDER — FENTANYL CITRATE (PF) 100 MCG/2ML IJ SOLN
INTRAMUSCULAR | Status: AC
  Filled 2015-06-26: qty 4

## 2015-06-26 MED ORDER — FLUTICASONE PROPIONATE 50 MCG/ACT NA SUSP
1.0000 | Freq: Every day | NASAL | Status: DC
  Administered 2015-06-26: 1 via NASAL
  Filled 2015-06-26: qty 16

## 2015-06-26 MED ORDER — LOSARTAN POTASSIUM 50 MG PO TABS
100.0000 mg | ORAL_TABLET | Freq: Every day | ORAL | Status: DC
Start: 2015-06-27 — End: 2015-06-27
  Administered 2015-06-27: 100 mg via ORAL
  Filled 2015-06-26: qty 2

## 2015-06-26 MED ORDER — PHENOL 1.4 % MT LIQD: 1.0000 | OROMUCOSAL | Status: DC | PRN

## 2015-06-26 MED ORDER — TRANEXAMIC ACID 1000 MG/10ML IV SOLN
1000.0000 mg | INTRAVENOUS | Status: AC
  Administered 2015-06-26: 1000 mg via INTRAVENOUS
  Filled 2015-06-26: qty 10

## 2015-06-26 MED ORDER — METFORMIN HCL 500 MG PO TABS
500.0000 mg | ORAL_TABLET | Freq: Every day | ORAL | Status: DC
  Administered 2015-06-27: 500 mg via ORAL
  Filled 2015-06-26 (×2): qty 1

## 2015-06-26 MED ORDER — OXYCODONE HCL 5 MG PO TABS: 5.0000 mg | ORAL_TABLET | Freq: Once | ORAL | Status: DC | PRN

## 2015-06-26 MED ORDER — METHOCARBAMOL 1000 MG/10ML IJ SOLN
500.0000 mg | Freq: Four times a day (QID) | INTRAVENOUS | Status: DC | PRN
  Administered 2015-06-26: 500 mg via INTRAVENOUS
  Filled 2015-06-26 (×2): qty 5

## 2015-06-26 MED ORDER — LACTATED RINGERS IV SOLN
INTRAVENOUS | Status: DC
  Administered 2015-06-26: 1000 mL via INTRAVENOUS

## 2015-06-26 MED ORDER — OXYCODONE HCL 5 MG/5ML PO SOLN
5.0000 mg | Freq: Once | ORAL | Status: DC | PRN
  Filled 2015-06-26: qty 5

## 2015-06-26 MED ORDER — TRAMADOL HCL 50 MG PO TABS
50.0000 mg | ORAL_TABLET | Freq: Four times a day (QID) | ORAL | Status: DC | PRN
Start: 2015-06-26 — End: 2015-06-27

## 2015-06-26 MED ORDER — AMLODIPINE BESYLATE 5 MG PO TABS
5.0000 mg | ORAL_TABLET | Freq: Every morning | ORAL | Status: DC
  Administered 2015-06-27: 5 mg via ORAL
  Filled 2015-06-26: qty 1

## 2015-06-26 MED ORDER — HYDROCHLOROTHIAZIDE 12.5 MG PO CAPS
12.5000 mg | ORAL_CAPSULE | Freq: Every day | ORAL | Status: DC
  Administered 2015-06-27: 12.5 mg via ORAL
  Filled 2015-06-26: qty 1

## 2015-06-26 MED ORDER — DEXAMETHASONE SODIUM PHOSPHATE 10 MG/ML IJ SOLN
10.0000 mg | Freq: Once | INTRAMUSCULAR | Status: AC
  Administered 2015-06-26: 10 mg via INTRAVENOUS

## 2015-06-26 MED ORDER — ACETAMINOPHEN 325 MG PO TABS: 650.0000 mg | ORAL_TABLET | Freq: Four times a day (QID) | ORAL | Status: DC | PRN

## 2015-06-26 MED ORDER — POTASSIUM CHLORIDE IN NACL 20-0.9 MEQ/L-% IV SOLN
INTRAVENOUS | Status: DC
  Administered 2015-06-26: 15:00:00 via INTRAVENOUS
  Filled 2015-06-26 (×2): qty 1000

## 2015-06-26 MED ORDER — 0.9 % SODIUM CHLORIDE (POUR BTL) OPTIME
TOPICAL | Status: DC | PRN
  Administered 2015-06-26: 1000 mL

## 2015-06-26 MED ORDER — ROSUVASTATIN CALCIUM 40 MG PO TABS
40.0000 mg | ORAL_TABLET | Freq: Every evening | ORAL | Status: DC
  Administered 2015-06-26: 40 mg via ORAL
  Filled 2015-06-26 (×2): qty 1

## 2015-06-26 MED ORDER — BISACODYL 10 MG RE SUPP: 10.0000 mg | Freq: Every day | RECTAL | Status: DC | PRN

## 2015-06-26 MED ORDER — CHLORHEXIDINE GLUCONATE 4 % EX LIQD: 60.0000 mL | Freq: Once | CUTANEOUS | Status: DC

## 2015-06-26 MED ORDER — ACETAMINOPHEN 10 MG/ML IV SOLN
INTRAVENOUS | Status: AC
  Filled 2015-06-26: qty 100

## 2015-06-26 MED ORDER — LOSARTAN POTASSIUM-HCTZ 100-12.5 MG PO TABS: 1.0000 | ORAL_TABLET | Freq: Every morning | ORAL | Status: DC

## 2015-06-26 MED ORDER — HYDROMORPHONE HCL 1 MG/ML IJ SOLN
0.2500 mg | INTRAMUSCULAR | Status: DC | PRN
  Administered 2015-06-26 (×2): 0.5 mg via INTRAVENOUS

## 2015-06-26 MED ORDER — CEFAZOLIN SODIUM-DEXTROSE 2-3 GM-% IV SOLR
INTRAVENOUS | Status: AC
  Filled 2015-06-26: qty 50

## 2015-06-26 MED ORDER — BUPIVACAINE HCL (PF) 0.25 % IJ SOLN
INTRAMUSCULAR | Status: AC
  Filled 2015-06-26: qty 30

## 2015-06-26 MED ORDER — STERILE WATER FOR IRRIGATION IR SOLN
Status: DC | PRN
  Administered 2015-06-26: 1000 mL

## 2015-06-26 MED ORDER — DEXAMETHASONE SODIUM PHOSPHATE 10 MG/ML IJ SOLN
10.0000 mg | Freq: Once | INTRAMUSCULAR | Status: AC
  Administered 2015-06-27: 10 mg via INTRAVENOUS
  Filled 2015-06-26 (×2): qty 1

## 2015-06-26 MED ORDER — MENTHOL 3 MG MT LOZG: 1.0000 | LOZENGE | OROMUCOSAL | Status: DC | PRN

## 2015-06-26 MED ORDER — METOCLOPRAMIDE HCL 5 MG/ML IJ SOLN: 5.0000 mg | Freq: Three times a day (TID) | INTRAMUSCULAR | Status: DC | PRN

## 2015-06-26 MED ORDER — ACETAMINOPHEN 650 MG RE SUPP: 650.0000 mg | Freq: Four times a day (QID) | RECTAL | Status: DC | PRN

## 2015-06-26 MED ORDER — ONDANSETRON HCL 4 MG PO TABS: 4.0000 mg | ORAL_TABLET | Freq: Four times a day (QID) | ORAL | Status: DC | PRN

## 2015-06-26 MED ORDER — BUPIVACAINE IN DEXTROSE 0.75-8.25 % IT SOLN
INTRATHECAL | Status: DC | PRN
  Administered 2015-06-26: 2 mL via INTRATHECAL

## 2015-06-26 MED ORDER — PROPOFOL 10 MG/ML IV BOLUS
INTRAVENOUS | Status: DC | PRN
  Administered 2015-06-26: 40 mg via INTRAVENOUS

## 2015-06-26 MED ORDER — MIDAZOLAM HCL 5 MG/5ML IJ SOLN
INTRAMUSCULAR | Status: DC | PRN
  Administered 2015-06-26: 2 mg via INTRAVENOUS

## 2015-06-26 MED ORDER — ONDANSETRON HCL 4 MG/2ML IJ SOLN: 4.0000 mg | Freq: Four times a day (QID) | INTRAMUSCULAR | Status: DC | PRN

## 2015-06-26 MED ORDER — ONDANSETRON HCL 4 MG/2ML IJ SOLN
INTRAMUSCULAR | Status: DC | PRN
  Administered 2015-06-26: 4 mg via INTRAVENOUS

## 2015-06-26 MED ORDER — ALLOPURINOL 300 MG PO TABS
300.0000 mg | ORAL_TABLET | Freq: Every day | ORAL | Status: DC
  Administered 2015-06-27: 300 mg via ORAL
  Filled 2015-06-26: qty 1

## 2015-06-26 MED ORDER — PHENYLEPHRINE HCL 10 MG/ML IJ SOLN
INTRAMUSCULAR | Status: DC | PRN
  Administered 2015-06-26 (×2): 40 ug via INTRAVENOUS
  Administered 2015-06-26: 80 ug via INTRAVENOUS

## 2015-06-26 MED ORDER — ACETAMINOPHEN 500 MG PO TABS
1000.0000 mg | ORAL_TABLET | Freq: Four times a day (QID) | ORAL | Status: AC
  Administered 2015-06-26 – 2015-06-27 (×4): 1000 mg via ORAL
  Filled 2015-06-26 (×4): qty 2

## 2015-06-26 MED ORDER — POLYETHYLENE GLYCOL 3350 17 G PO PACK: 17.0000 g | PACK | Freq: Every day | ORAL | Status: DC | PRN

## 2015-06-26 MED ORDER — OXYCODONE HCL 5 MG PO TABS
5.0000 mg | ORAL_TABLET | ORAL | Status: DC | PRN
  Administered 2015-06-26 (×3): 5 mg via ORAL
  Administered 2015-06-27: 10 mg via ORAL
  Administered 2015-06-27: 5 mg via ORAL
  Administered 2015-06-27: 10 mg via ORAL
  Filled 2015-06-26: qty 2
  Filled 2015-06-26 (×2): qty 1
  Filled 2015-06-26: qty 2
  Filled 2015-06-26 (×4): qty 1

## 2015-06-26 MED ORDER — DOCUSATE SODIUM 100 MG PO CAPS
100.0000 mg | ORAL_CAPSULE | Freq: Two times a day (BID) | ORAL | Status: DC
  Administered 2015-06-26 – 2015-06-27 (×2): 100 mg via ORAL

## 2015-06-26 MED ORDER — METOCLOPRAMIDE HCL 10 MG PO TABS: 5.0000 mg | ORAL_TABLET | Freq: Three times a day (TID) | ORAL | Status: DC | PRN

## 2015-06-26 MED ORDER — METHOCARBAMOL 500 MG PO TABS: 500.0000 mg | ORAL_TABLET | Freq: Four times a day (QID) | ORAL | Status: DC | PRN

## 2015-06-26 MED ORDER — HYDROMORPHONE HCL 1 MG/ML IJ SOLN
INTRAMUSCULAR | Status: AC
  Filled 2015-06-26: qty 1

## 2015-06-26 MED ORDER — ACETAMINOPHEN 10 MG/ML IV SOLN
1000.0000 mg | Freq: Once | INTRAVENOUS | Status: AC
  Administered 2015-06-26: 1000 mg via INTRAVENOUS
  Filled 2015-06-26: qty 100

## 2015-06-26 SURGICAL SUPPLY — 43 items
BAG DECANTER FOR FLEXI CONT (MISCELLANEOUS) ×3 IMPLANT
BAG ZIPLOCK 12X15 (MISCELLANEOUS) IMPLANT
BLADE EXTENDED COATED 6.5IN (ELECTRODE) ×3 IMPLANT
BLADE SAG 18X100X1.27 (BLADE) ×3 IMPLANT
CAPT HIP TOTAL 2 ×3 IMPLANT
CLOSURE WOUND 1/2 X4 (GAUZE/BANDAGES/DRESSINGS) ×1
COVER PERINEAL POST (MISCELLANEOUS) ×3 IMPLANT
DECANTER SPIKE VIAL GLASS SM (MISCELLANEOUS) ×3 IMPLANT
DRAPE C-ARM 42X120 X-RAY (DRAPES) ×3 IMPLANT
DRAPE STERI IOBAN 125X83 (DRAPES) ×3 IMPLANT
DRAPE U-SHAPE 47X51 STRL (DRAPES) ×9 IMPLANT
DRSG ADAPTIC 3X8 NADH LF (GAUZE/BANDAGES/DRESSINGS) ×3 IMPLANT
DRSG MEPILEX BORDER 4X4 (GAUZE/BANDAGES/DRESSINGS) ×3 IMPLANT
DRSG MEPILEX BORDER 4X8 (GAUZE/BANDAGES/DRESSINGS) ×3 IMPLANT
DURAPREP 26ML APPLICATOR (WOUND CARE) ×3 IMPLANT
ELECT REM PT RETURN 9FT ADLT (ELECTROSURGICAL) ×3
ELECTRODE REM PT RTRN 9FT ADLT (ELECTROSURGICAL) ×1 IMPLANT
EVACUATOR 1/8 PVC DRAIN (DRAIN) ×3 IMPLANT
FACESHIELD WRAPAROUND (MASK) ×12 IMPLANT
GLOVE BIO SURGEON STRL SZ7.5 (GLOVE) ×3 IMPLANT
GLOVE BIO SURGEON STRL SZ8 (GLOVE) ×6 IMPLANT
GLOVE BIOGEL PI IND STRL 8 (GLOVE) ×2 IMPLANT
GLOVE BIOGEL PI INDICATOR 8 (GLOVE) ×4
GOWN STRL REUS W/TWL LRG LVL3 (GOWN DISPOSABLE) ×3 IMPLANT
GOWN STRL REUS W/TWL XL LVL3 (GOWN DISPOSABLE) ×3 IMPLANT
KIT BASIN OR (CUSTOM PROCEDURE TRAY) ×3 IMPLANT
NDL SAFETY ECLIPSE 18X1.5 (NEEDLE) ×1 IMPLANT
NEEDLE HYPO 18GX1.5 SHARP (NEEDLE) ×2
PACK TOTAL JOINT (CUSTOM PROCEDURE TRAY) ×3 IMPLANT
PEN SKIN MARKING BROAD (MISCELLANEOUS) ×3 IMPLANT
STRIP CLOSURE SKIN 1/2X4 (GAUZE/BANDAGES/DRESSINGS) ×2 IMPLANT
SUT ETHIBOND NAB CT1 #1 30IN (SUTURE) ×3 IMPLANT
SUT MNCRL AB 4-0 PS2 18 (SUTURE) ×3 IMPLANT
SUT VIC AB 2-0 CT1 27 (SUTURE) ×4
SUT VIC AB 2-0 CT1 TAPERPNT 27 (SUTURE) ×2 IMPLANT
SUT VLOC 180 0 24IN GS25 (SUTURE) ×3 IMPLANT
SYR 30ML LL (SYRINGE) ×3 IMPLANT
SYR 50ML LL SCALE MARK (SYRINGE) IMPLANT
TOWEL OR 17X26 10 PK STRL BLUE (TOWEL DISPOSABLE) ×3 IMPLANT
TOWEL OR NON WOVEN STRL DISP B (DISPOSABLE) IMPLANT
TRAY FOLEY W/METER SILVER 14FR (SET/KITS/TRAYS/PACK) ×3 IMPLANT
TRAY FOLEY W/METER SILVER 16FR (SET/KITS/TRAYS/PACK) ×3 IMPLANT
YANKAUER SUCT BULB TIP 10FT TU (MISCELLANEOUS) IMPLANT

## 2015-06-26 NOTE — Transfer of Care (Signed)
Immediate Anesthesia Transfer of Care Note  Patient: Corey Fuller  Procedure(s) Performed: Procedure(s): RIGHT TOTAL HIP ARTHROPLASTY ANTERIOR APPROACH (Right)  Patient Location: PACU  Anesthesia Type:Spinal  Level of Consciousness:  sedated, patient cooperative and responds to stimulation  Airway & Oxygen Therapy:Patient Spontanous Breathing and Patient connected to face mask oxgen  Post-op Assessment:  Report given to PACU RN and Post -op Vital signs reviewed and stable  Post vital signs:  Reviewed and stable  Last Vitals:  Filed Vitals:   06/26/15 0821  BP: 140/79  Pulse: 99  Temp: 36.6 C  Resp: 18    Complications: No apparent anesthesia complications, S1 level on release to PACU staff denied pain on assessment.

## 2015-06-26 NOTE — Addendum Note (Signed)
Addendum  created 06/26/15 1345 by Paris Lore, CRNA   Modules edited: Anesthesia Medication Administration

## 2015-06-26 NOTE — Anesthesia Preprocedure Evaluation (Addendum)
Anesthesia Evaluation  Patient identified by MRN, date of birth, ID band Patient awake    Reviewed: Allergy & Precautions, H&P , NPO status , Patient's Chart, lab work & pertinent test results  History of Anesthesia Complications Negative for: history of anesthetic complications  Airway Mallampati: II  TM Distance: >3 FB Neck ROM: full    Dental no notable dental hx.    Pulmonary neg pulmonary ROS,    Pulmonary exam normal breath sounds clear to auscultation       Cardiovascular hypertension, Pt. on medications Normal cardiovascular exam Rhythm:regular Rate:Normal  Echo 04/19/15 with normal EF 55%, no significant valve abnormalities, cardiology clearance by Dr. Tenny Craw for hip surgery   Neuro/Psych negative neurological ROS     GI/Hepatic Neg liver ROS, GERD  ,  Endo/Other  diabetes  Renal/GU Renal disease     Musculoskeletal  (+) Arthritis ,   Abdominal   Peds  Hematology negative hematology ROS (+)   Anesthesia Other Findings   Reproductive/Obstetrics negative OB ROS                            Anesthesia Physical Anesthesia Plan  ASA: III  Anesthesia Plan: Spinal   Post-op Pain Management:    Induction: Intravenous  Airway Management Planned: Simple Face Mask  Additional Equipment:   Intra-op Plan:   Post-operative Plan:   Informed Consent: I have reviewed the patients History and Physical, chart, labs and discussed the procedure including the risks, benefits and alternatives for the proposed anesthesia with the patient or authorized representative who has indicated his/her understanding and acceptance.     Plan Discussed with: Anesthesiologist, CRNA and Surgeon  Anesthesia Plan Comments:         Anesthesia Quick Evaluation

## 2015-06-26 NOTE — Progress Notes (Signed)
X-ray results noted 

## 2015-06-26 NOTE — Progress Notes (Signed)
Portable AP Pelvis X-ray done. 

## 2015-06-26 NOTE — Op Note (Signed)
OPERATIVE REPORT  PREOPERATIVE DIAGNOSIS: Osteoarthritis of the Right hip.   POSTOPERATIVE DIAGNOSIS: Osteoarthritis of the Right  hip.   PROCEDURE: Right total hip arthroplasty, anterior approach.   SURGEON: Ollen Gross, MD   ASSISTANT: Avel Peace, PA-C  ANESTHESIA:  Spinal  ESTIMATED BLOOD LOSS:-250 ml    DRAINS: Hemovac x1.   COMPLICATIONS: None   CONDITION: PACU - hemodynamically stable.   BRIEF CLINICAL NOTE: Corey Fuller is a 69 y.o. male who has advanced end-  stage arthritis of his Right  hip with progressively worsening pain and  dysfunction.The patient has failed nonoperative management and presents for  total hip arthroplasty.   PROCEDURE IN DETAIL: After successful administration of spinal  anesthetic, the traction boots for the [facility] bed were placed on both  feet and the patient was placed onto the [facility] bed, boots placed into the leg  holders. The Right hip was then isolated from the perineum with plastic  drapes and prepped and draped in the usual sterile fashion. ASIS and  greater trochanter were marked and a oblique incision was made, starting  at about 1 cm lateral and 2 cm distal to the ASIS and coursing towards  the anterior cortex of the femur. The skin was cut with a 10 blade  through subcutaneous tissue to the level of the fascia overlying the  tensor fascia lata muscle. The fascia was then incised in line with the  incision at the junction of the anterior third and posterior 2/3rd. The  muscle was teased off the fascia and then the interval between the TFL  and the rectus was developed. The Hohmann retractor was then placed at  the top of the femoral neck over the capsule. The vessels overlying the  capsule were cauterized and the fat on top of the capsule was removed.  A Hohmann retractor was then placed anterior underneath the rectus  femoris to give exposure to the entire anterior capsule. A T-shaped  capsulotomy was performed. The  edges were tagged and the femoral head  was identified.       Osteophytes are removed off the superior acetabulum.  The femoral neck was then cut in situ with an oscillating saw. Traction  was then applied to the left lower extremity utilizing the [facility]  traction. The femoral head was then removed. Retractors were placed  around the acetabulum and then circumferential removal of the labrum was  performed. Osteophytes were also removed. Reaming starts at 45 mm to  medialize and  Increased in 2 mm increments to 51 mm. We reamed in  approximately 40 degrees of abduction, 20 degrees anteversion. A 52 mm  pinnacle acetabular shell was then impacted in anatomic position under  fluoroscopic guidance with excellent purchase. We did not need to place  any additional dome screws. A 32 mm neutral + 4 marathon liner was then  placed into the acetabular shell.       The femoral lift was then placed along the lateral aspect of the femur  just distal to the vastus ridge. The leg was  externally rotated and capsule  was stripped off the inferior aspect of the femoral neck down to the  level of the lesser trochanter, this was done with electrocautery. The femur was lifted after this was performed. The  leg was then placed and extended in adducted position to essentially delivering the femur. We also removed the capsule superiorly and the  piriformis from the piriformis  fossa to gain excellent exposure of the  proximal femur. Rongeur was used to remove some cancellous bone to get  into the lateral portion of the proximal femur for placement of the  initial starter reamer. The starter broaches was placed  the starter broach  and was shown to go down the center of the canal. Broaching  with the  Corail system was then performed starting at size 8, coursing  Up to size 13. A size 13 had excellent torsional and rotational  and axial stability. The trial high offset neck was then placed  with a 32 + 5 trial head.  The hip was then reduced. We confirmed that  the stem was in the canal both on AP and lateral x-rays. It also has excellent sizing. The hip was reduced with outstanding stability through full extension, full external rotation,  and then flexion in adduction internal rotation. AP pelvis was taken  and the leg lengths were measured and found to be exactly equal. Hip  was then dislocated again and the femoral head and neck removed. The  femoral broach was removed. Size 13 Corail stem with a high offset  neck was then impacted into the femur following native anteversion. Has  excellent purchase in the canal. Excellent torsional and rotational and  axial stability. It is confirmed to be in the canal on AP and lateral  fluoroscopic views. The 32 + 5 ceramic head was placed and the hip  reduced with outstanding stability. Again AP pelvis was taken and it  confirmed that the leg lengths were equal. The wound was then copiously  irrigated with saline solution and the capsule reattached and repaired  with Ethibond suture.  20 mL of Exparel mixed with 50 mL of saline then additional 20 ml of .25% Bupivicaine injected into the capsule and into the edge of the tensor fascia lata as well as subcutaneous tissue. The fascia overlying the tensor fascia lata was  then closed with a running #1 V-Loc. Subcu was closed with interrupted  2-0 Vicryl and subcuticular running 4-0 Monocryl. Incision was cleaned  and dried. Steri-Strips and a bulky sterile dressing applied. Hemovac  drain was hooked to suction and then he was awakened and transported to  recovery in stable condition.        Please note that a surgical assistant was a medical necessity for this procedure to perform it in a safe and expeditious manner. Assistant was necessary to provide appropriate retraction of vital neurovascular structures and to prevent femoral fracture and allow for anatomic placement of the prosthesis.  Ollen Gross, M.D.

## 2015-06-26 NOTE — Interval H&P Note (Signed)
History and Physical Interval Note:  06/26/2015 9:32 AM  Corey Fuller  has presented today for surgery, with the diagnosis of RIGHT HIP OA   The various methods of treatment have been discussed with the patient and family. After consideration of risks, benefits and other options for treatment, the patient has consented to  Procedure(s): RIGHT TOTAL HIP ARTHROPLASTY ANTERIOR APPROACH (Right) as a surgical intervention .  The patient's history has been reviewed, patient examined, no change in status, stable for surgery.  I have reviewed the patient's chart and labs.  Questions were answered to the patient's satisfaction.     Loanne Drilling

## 2015-06-26 NOTE — Progress Notes (Signed)
Utilization review completed.  

## 2015-06-26 NOTE — Anesthesia Postprocedure Evaluation (Signed)
  Anesthesia Post-op Note  Patient: Corey Fuller  Procedure(s) Performed: Procedure(s) (LRB): RIGHT TOTAL HIP ARTHROPLASTY ANTERIOR APPROACH (Right)  Patient Location: PACU  Anesthesia Type: Spinal  Level of Consciousness: awake and alert   Airway and Oxygen Therapy: Patient Spontanous Breathing  Post-op Pain: mild  Post-op Assessment: Post-op Vital signs reviewed, Patient's Cardiovascular Status Stable, Respiratory Function Stable, Patent Airway and No signs of Nausea or vomiting  Last Vitals:  Filed Vitals:   06/26/15 1330  BP: 111/66  Pulse: 75  Temp: 36.6 C  Resp: 13    Post-op Vital Signs: stable   Complications: No apparent anesthesia complications

## 2015-06-26 NOTE — Evaluation (Signed)
Physical Therapy Evaluation Patient Details Name: Shahzain Kiester MRN: [id number] DOB: [birth year] Today's Date: 06/26/2015   History of Present Illness  R DA THA  Clinical Impression  Pt will benefit from PT to address deficits below; plan is for home to dtr's house here in [place] with HHPT; pt should progress well    Follow Up Recommendations Home health PT    Equipment Recommendations  None recommended by PT    Recommendations for Other Services       Precautions / Restrictions Restrictions Weight Bearing Restrictions: No Other Position/Activity Restrictions: WBAT      Mobility  Bed Mobility Overal bed mobility: Needs Assistance Bed Mobility: Supine to Sit     Supine to sit: Min assist     General bed mobility comments: cues for technique  Transfers Overall transfer level: Needs assistance Equipment used: Rolling walker (2 wheeled) Transfers: Sit to/from Stand Sit to Stand: Min assist         General transfer comment: cues for hand placement and safety  Ambulation/Gait Ambulation/Gait assistance: Min assist Ambulation Distance (Feet): 60 Feet Assistive device: Rolling walker (2 wheeled) Gait Pattern/deviations: Step-to pattern;Antalgic;Trunk flexed     General Gait Details: cues for RW position, step length and safety  Stairs            Wheelchair Mobility    Modified Rankin (Stroke Patients Only)       Balance                                             Pertinent Vitals/Pain Pain Assessment: 0-10 Pain Score: 3  Pain Location: hip  Pain Descriptors / Indicators: Sore Pain Intervention(s): Limited activity within patient's tolerance;Monitored during session;Premedicated before session    Home Living Family/patient expects to be discharged to:: Private residence Living Arrangements: Spouse/significant other Available Help at Discharge: Family;Available 24 hours/day Type of Home: House Home Access: Stairs to  enter   [employer] of Steps: 2 Home Layout: One level Home Equipment: Walker - 2 wheels;Cane - single point      Prior Function Level of Independence: Independent with assistive device(s);Independent         Comments: cane occasionally     Hand Dominance        Extremity/Trunk Assessment   Upper Extremity Assessment: Defer to OT evaluation           Lower Extremity Assessment: RLE deficits/detail RLE Deficits / Details: hip flexion 2+/5, limtied by soreness; ankle WFL; knee 3 to 3+/5       Communication   Communication: No difficulties  Cognition Arousal/Alertness: Awake/alert Behavior During Therapy: WFL for tasks assessed/performed Overall Cognitive Status: Within Functional Limits for tasks assessed                      General Comments      Exercises Total Joint Exercises Ankle Circles/Pumps: AROM;Both;10 reps Quad Sets: 10 reps;Both;AROM      Assessment/Plan    PT Assessment Patient needs continued PT services  PT Diagnosis Difficulty walking   PT Problem List Decreased strength;Decreased range of motion;Decreased activity tolerance;Decreased mobility  PT Treatment Interventions DME instruction;Gait training;Functional mobility training;Therapeutic activities;Patient/family education;Stair training;Therapeutic exercise   PT Goals (Current goals can be found in the Care Plan section) Acute Rehab PT Goals Patient Stated Goal: return to I PT Goal Formulation: With patient Time For  Goal Achievement: 06/30/15 Potential to Achieve Goals: Good    Frequency 7X/week   Barriers to discharge        Co-evaluation               End of Session Equipment Utilized During Treatment: Gait belt Activity Tolerance: Patient tolerated treatment well Patient left: in chair;with call bell/phone within reach;with family/visitor present Nurse Communication: Mobility status         Time: 1610-9604     Charges:   PT  Evaluation $Initial PT Evaluation Tier I: 1 Procedure PT Treatments $Gait Training: 8-22 mins   PT G Codes:        WILLIAMS,TARA 2015-07-03, 4:24 PM

## 2015-06-26 NOTE — Anesthesia Procedure Notes (Addendum)
Spinal Patient location during procedure: OR Staffing Anesthesiologist: JUDD, BENJAMIN Resident/CRNA: Anne Fu Performed by: resident/CRNA and anesthesiologist  Preanesthetic Checklist Completed: patient identified, site marked, surgical consent, pre-op evaluation, timeout performed, IV checked, risks and benefits discussed and monitors and equipment checked Spinal Block Patient position: sitting Prep: ChloraPrep Patient monitoring: heart rate, continuous pulse ox and blood pressure Approach: left paramedian Location: L4-5 Injection technique: single-shot Needle Needle type: Quincke  Needle gauge: 22 G Needle length: 9 cm Additional Notes Expiration date of kit checked and confirmed. Patient tolerated procedure well, without complications.

## 2015-06-27 LAB — CBC
HEMATOCRIT: 39.4 % (ref 39.0–52.0)
Hemoglobin: 13.3 g/dL (ref 13.0–17.0)
MCH: 30.7 pg (ref 26.0–34.0)
MCHC: 33.8 g/dL (ref 30.0–36.0)
MCV: 91 fL (ref 78.0–100.0)
Platelets: 227 10*3/uL (ref 150–400)
RBC: 4.33 MIL/uL (ref 4.22–5.81)
RDW: 13.2 % (ref 11.5–15.5)
WBC: 15.7 10*3/uL — AB (ref 4.0–10.5)

## 2015-06-27 LAB — BASIC METABOLIC PANEL
ANION GAP: 9 (ref 5–15)
BUN: 20 mg/dL (ref 6–20)
CO2: 24 mmol/L (ref 22–32)
Calcium: 9.4 mg/dL (ref 8.9–10.3)
Chloride: 108 mmol/L (ref 101–111)
Creatinine, Ser: 1.14 mg/dL (ref 0.61–1.24)
GLUCOSE: 135 mg/dL — AB (ref 65–99)
POTASSIUM: 4.2 mmol/L (ref 3.5–5.1)
Sodium: 141 mmol/L (ref 135–145)

## 2015-06-27 LAB — GLUCOSE, CAPILLARY
GLUCOSE-CAPILLARY: 114 mg/dL — AB (ref 65–99)
GLUCOSE-CAPILLARY: 135 mg/dL — AB (ref 65–99)

## 2015-06-27 MED ORDER — RIVAROXABAN 10 MG PO TABS: 10.0000 mg | ORAL_TABLET | Freq: Every day | ORAL | Status: AC

## 2015-06-27 MED ORDER — OMEPRAZOLE 20 MG PO CPDR
40.0000 mg | DELAYED_RELEASE_CAPSULE | Freq: Every day | ORAL | Status: DC
  Administered 2015-06-27: 40 mg via ORAL
  Filled 2015-06-27: qty 2

## 2015-06-27 MED ORDER — TRAMADOL HCL 50 MG PO TABS: 50.0000 mg | ORAL_TABLET | Freq: Four times a day (QID) | ORAL | Status: AC | PRN

## 2015-06-27 MED ORDER — METHOCARBAMOL 500 MG PO TABS: 500.0000 mg | ORAL_TABLET | Freq: Four times a day (QID) | ORAL | Status: AC | PRN

## 2015-06-27 MED ORDER — OXYCODONE HCL 5 MG PO TABS: 5.0000 mg | ORAL_TABLET | ORAL | Status: AC | PRN

## 2015-06-27 NOTE — Progress Notes (Signed)
   Subjective: 1 Day Post-Op Procedure(s) (LRB): RIGHT TOTAL HIP ARTHROPLASTY ANTERIOR APPROACH (Right) Patient reports pain as mild.   Patient seen in rounds with Dr. Lequita Halt. Ambulated 60 feet with therapy yesterday. Patient is well, and has had no acute complaints or problems Patient is ready to go home later today if does well with both therapy sessions.  Objective: Vital signs in last 24 hours: Temp:  [97.6 F (36.4 C)-98.5 F (36.9 C)] 98.2 F (36.8 C) (09/20 0513) Pulse Rate:  [69-94] 69 (09/20 0513) Resp:  [11-16] 16 (09/20 0513) BP: (105-128)/(61-81) 119/65 mmHg (09/20 0513) SpO2:  [92 %-100 %] 95 % (09/20 0513) Weight:  [101.606 kg (224 lb)] 101.606 kg (224 lb) (09/19 0844)  Intake/Output from previous day:  Intake/Output Summary (Last 24 hours) at 06/27/15 0837 Last data filed at 06/27/15 0454  Gross per 24 hour  Intake 3931.25 ml  Output   2020 ml  Net 1911.25 ml    Intake/Output this shift: Total I/O In: 240 [P.O.:240] Out: -   Labs:  Recent Labs  06/27/15 0454  HGB 13.3    Recent Labs  06/27/15 0454  WBC 15.7*  RBC 4.33  HCT 39.4  PLT 227    Recent Labs  06/27/15 0454  NA 141  K 4.2  CL 108  CO2 24  BUN 20  CREATININE 1.14  GLUCOSE 135*  CALCIUM 9.4   No results for input(s): LABPT, INR in the last 72 hours.  EXAM: General - Patient is Alert and Appropriate Extremity - Neurovascular intact Sensation intact distally Dressing - clean, dry Motor Function - intact, moving foot and toes well on exam.   Assessment/Plan: 1 Day Post-Op Procedure(s) (LRB): RIGHT TOTAL HIP ARTHROPLASTY ANTERIOR APPROACH (Right) Procedure(s) (LRB): RIGHT TOTAL HIP ARTHROPLASTY ANTERIOR APPROACH (Right) Past Medical History  Diagnosis Date  . Hypertension   . Kidney stone   . Enlarged prostate   . Borderline diabetes   . Hearing difficulty of both ears     can not hear high freq  . History of bronchitis     3 to 4 years ago   . Urinary  frequency   . Nocturia   . GERD (gastroesophageal reflux disease)   . History of hiatal hernia   . Arthritis   . Carpal tunnel syndrome     bilat   . History of shingles     2 years ago   . Bright's disease    Principal Problem:   OA (osteoarthritis) of hip  Estimated body mass index is 36.17 kg/(m^2) as calculated from the following:   Height as of this encounter:  (1.676 m).   Weight as of this encounter: 101.606 kg (224 lb). Advance diet Up with therapy Diet - Cardiac diet and Diabetic diet Follow up - in 2 weeks Activity - WBAT Disposition - Home Condition Upon Discharge - Improving D/C Meds - See DC Summary DVT Prophylaxis - Xarelto  Avel Peace, PA-C Orthopaedic Surgery 06/27/2015, 8:37 AM

## 2015-06-27 NOTE — Progress Notes (Signed)
   06/27/15 1300  PT Visit Information  Last PT Received On 06/27/15  Assistance Needed +1  History of Present Illness R DA THA  PT Time Calculation  PT Start Time (ACUTE ONLY) 1158  PT Stop Time (ACUTE ONLY) 1208  PT Time Calculation (min) (ACUTE ONLY) 10 min  Subjective Data  Patient Stated Goal go home today  Precautions  Precautions Fall  Restrictions  Other Position/Activity Restrictions WBAT  Pain Assessment  Pain Assessment No/denies pain  Cognition  Arousal/Alertness Awake/alert  Behavior During Therapy WFL for tasks assessed/performed  Overall Cognitive Status Within Functional Limits for tasks assessed  Transfers  Overall transfer level Needs assistance  Equipment used Rolling walker (2 wheeled)  Transfers Sit to/from Stand  Sit to Stand Modified independent (Device/Increase time)  Ambulation/Gait  Ambulation/Gait assistance Supervision  Ambulation Distance (Feet) 60 Feet  Assistive device Rolling walker (2 wheeled)  Gait Pattern/deviations Step-to pattern;Step-through pattern  General Gait Details cues for RW position, step length and safety  Stairs Yes  Stairs assistance Min guard  Stair Management No rails;Backwards;Step to pattern;With walker  Balance  Standing balance-Leahy Scale Poor  Standing balance comment unsteady without UE support  PT - End of Session  Equipment Utilized During Treatment Gait belt  Activity Tolerance Patient tolerated treatment well  Patient left in chair;with call bell/phone within reach  Nurse Communication Mobility status  PT - Assessment/Plan  PT Plan Current plan remains appropriate  PT Frequency (ACUTE ONLY) 7X/week  Follow Up Recommendations Home health PT  PT equipment None recommended by PT  PT Goal Progression  Progress towards PT goals Progressing toward goals  Acute Rehab PT Goals  PT Goal Formulation With patient  Time For Goal Achievement 06/30/15  Potential to Achieve Goals Good  PT General Charges  $$ ACUTE  PT VISIT 1 Procedure  PT Treatments  $Gait Training 8-22 mins

## 2015-06-27 NOTE — Progress Notes (Signed)
Physical Therapy Treatment Patient Details Name: Corey Fuller MRN: [id number] DOB: [birth year] Today's Date: 07-17-15    History of Present Illness R DA THA    PT Comments    Progressing well; will practice 2 stairs later today and should be ready for D/C from PT standpoint after that session  Follow Up Recommendations  Home health PT     Equipment Recommendations  None recommended by PT    Recommendations for Other Services       Precautions / Restrictions Restrictions Other Position/Activity Restrictions: WBAT    Mobility  Bed Mobility Overal bed mobility: Needs Assistance Bed Mobility: Supine to Sit     Supine to sit: Min assist     General bed mobility comments: cues for technique, assist with RLE  Transfers Overall transfer level: Needs assistance Equipment used: Rolling walker (2 wheeled) Transfers: Sit to/from Stand Sit to Stand: Min assist         General transfer comment: cues for hand placement and safety  Ambulation/Gait Ambulation/Gait assistance: Min guard;Supervision Ambulation Distance (Feet): 120 Feet Assistive device: Rolling walker (2 wheeled) Gait Pattern/deviations: Step-to pattern;Step-through pattern     General Gait Details: cues for RW position, step length and safety   Stairs            Wheelchair Mobility    Modified Rankin (Stroke Patients Only)       Balance                                    Cognition Arousal/Alertness: Awake/alert Behavior During Therapy: WFL for tasks assessed/performed Overall Cognitive Status: Within Functional Limits for tasks assessed                      Exercises Total Joint Exercises Ankle Circles/Pumps: AROM;Both;10 reps Quad Sets: 10 reps;Both;AROM Short Arc Quad: AROM;Right;10 reps;Strengthening Heel Slides: AAROM;Right;10 reps Hip ABduction/ADduction: AROM;AAROM;Right;10 reps    General Comments        Pertinent Vitals/Pain Pain Assessment:  No/denies pain    Home Living                      Prior Function            PT Goals (current goals can now be found in the care plan section) Acute Rehab PT Goals Patient Stated Goal: return to I PT Goal Formulation: With patient Time For Goal Achievement: 06/30/15 Potential to Achieve Goals: Good Progress towards PT goals: Progressing toward goals    Frequency  7X/week    PT Plan Current plan remains appropriate    Co-evaluation             End of Session Equipment Utilized During Treatment: Gait belt Activity Tolerance: Patient tolerated treatment well Patient left: in chair;with call bell/phone within reach;with family/visitor present     Time: 7829-5621 PT Time Calculation (min) (ACUTE ONLY): 23 min  Charges:  $Gait Training: 8-22 mins $Therapeutic Exercise: 8-22 mins                    G Codes:      WILLIAMS,TARA July 17, 2015, 9:24 AM

## 2015-06-27 NOTE — Progress Notes (Signed)
Pt to d/c home with [facility] home [facility]. DME (3n1) delivered to room prior to d/c. Patient medicated for pain prior to d/c. AVS reviewed and "My Chart" discussed with pt. Pt capable of verbalizing medications, dressing changes, signs and symptoms of infection, and follow-up appointments. Remains hemodynamically stable. No signs and symptoms of distress. Educated pt to return to ER in the case of SOB, dizziness, or chest pain.

## 2015-06-27 NOTE — Care Management Note (Signed)
Case Management Note  Patient Details  Name: Corey Fuller MRN: [id number] Date of Birth: 06/15/1946  Subjective/Objective:                   RIGHT TOTAL HIP ARTHROPLASTY ANTERIOR APPROACH (Right) Action/Plan: Discharge planning  Expected Discharge Date:  06/27/15               Expected Discharge Plan:  [place]  In-House Referral:     Discharge planning Services  CM Consult  Post Acute Care Choice:  Home Health Choice offered to:  Patient  DME Arranged:  3-N-1 DME Agency:  [place]:  PT [place] Agency:  [facility]  Status of Service:  Completed, signed off  Medicare Important Message Given:    Date Medicare IM Given:    Medicare IM give by:    Date Additional Medicare IM Given:    Additional Medicare Important Message give by:     If discussed at [place] of Stay Meetings, dates discussed:    Additional Comments: CM met with pt in room to offer choice of home health agency.  Pt chooses [facility] to render HHPT.  Address and contact information verified by pt.  Referral called to [employer], Tim.  CM caled [facility] DMRE rep, Lecretia to please deliver the 3n1 to room prior to discharge.  NO other CM needs were communicated. Dellie Catholic, RN 06/27/2015, 1:34 PM

## 2015-06-27 NOTE — Discharge Instructions (Signed)
° °Dr. Frank Aluisio °Total Joint Specialist °[facility] °[address] °[place], [place] [address] °[phone] ° °ANTERIOR APPROACH TOTAL HIP REPLACEMENT POSTOPERATIVE DIRECTIONS ° ° °Hip Rehabilitation, Guidelines Following Surgery  °The results of a hip operation are greatly improved after range of motion and muscle strengthening exercises. Follow all safety measures which are given to protect your hip. If any of these exercises cause increased pain or swelling in your joint, decrease the amount until you are comfortable again. Then slowly increase the exercises. Call your caregiver if you have problems or questions.  ° °HOME CARE INSTRUCTIONS  °Remove items at home which could result in a fall. This includes throw rugs or furniture in walking pathways.  °· ICE to the affected hip every three hours for 30 minutes at a time and then as needed for pain and swelling.  Continue to use ice on the hip for pain and swelling from surgery. You may notice swelling that will progress down to the foot and ankle.  This is normal after surgery.  Elevate the leg when you are not up walking on it.   °· Continue to use the breathing machine which will help keep your temperature down.  It is common for your temperature to cycle up and down following surgery, especially at night when you are not up moving around and exerting yourself.  The breathing machine keeps your lungs expanded and your temperature down. ° ° °DIET °You may resume your previous home diet once your are discharged from the hospital. ° °DRESSING / WOUND CARE / SHOWERING °You may shower 3 days after surgery, but keep the wounds dry during showering.  You may use an occlusive plastic wrap (Press'n Seal for example), NO SOAKING/SUBMERGING IN THE BATHTUB.  If the bandage gets wet, change with a clean dry gauze.  If the incision gets wet, pat the wound dry with a clean towel. °You may start showering once you are discharged home but do not  submerge the incision under water. Just pat the incision dry and apply a dry gauze dressing on daily. °Change the surgical dressing daily and reapply a dry dressing each time. ° °ACTIVITY °Walk with your walker as instructed. °Use walker as long as suggested by your caregivers. °Avoid periods of inactivity such as sitting longer than an hour when not asleep. This helps prevent blood clots.  °You may resume a sexual relationship in one month or when given the OK by your doctor.  °You may return to work once you are cleared by your doctor.  °Do not drive a car for 6 weeks or until released by you surgeon.  °Do not drive while taking narcotics. ° °WEIGHT BEARING °Weight bearing as tolerated with assist device (walker, cane, etc) as directed, use it as long as suggested by your surgeon or therapist, typically at least 4-6 weeks. ° °POSTOPERATIVE CONSTIPATION PROTOCOL °Constipation - defined medically as fewer than three stools per week and severe constipation as less than one stool per week. ° °One of the most common issues patients have following surgery is constipation.  Even if you have a regular bowel pattern at home, your normal regimen is likely to be disrupted due to multiple reasons following surgery.  Combination of anesthesia, postoperative narcotics, change in appetite and fluid intake all can affect your bowels.  In order to avoid complications following surgery, here are some recommendations in order to help you during your recovery period. ° °Colace (docusate) - Pick up an over-the-counter   form of Colace or another stool softener and take twice a day as long as you are requiring postoperative pain medications.  Take with a full glass of water daily.  If you experience loose stools or diarrhea, hold the colace until you stool forms back up.  If your symptoms do not get better within 1 week or if they get worse, check with your doctor. ° °Dulcolax (bisacodyl) - Pick up over-the-counter and take as directed  by the product packaging as needed to assist with the movement of your bowels.  Take with a full glass of water.  Use this product as needed if not relieved by Colace only.  ° °MiraLax (polyethylene glycol) - Pick up over-the-counter to have on hand.  MiraLax is a solution that will increase the amount of water in your bowels to assist with bowel movements.  Take as directed and can mix with a glass of water, juice, soda, coffee, or tea.  Take if you go more than two days without a movement. °Do not use MiraLax more than once per day. Call your doctor if you are still constipated or irregular after using this medication for 7 days in a row. ° °If you continue to have problems with postoperative constipation, please contact the office for further assistance and recommendations.  If you experience "the worst abdominal pain ever" or develop nausea or vomiting, please contact the office immediatly for further recommendations for treatment. ° °ITCHING ° If you experience itching with your medications, try taking only a single pain pill, or even half a pain pill at a time.  You can also use Benadryl over the counter for itching or also to help with sleep.  ° °TED HOSE STOCKINGS °Wear the elastic stockings on both legs for three weeks following surgery during the day but you may remove then at night for sleeping. ° °MEDICATIONS °See your medication summary on the “After Visit Summary” that the nursing staff will review with you prior to discharge.  You may have some home medications which will be placed on hold until you complete the course of blood thinner medication.  It is important for you to complete the blood thinner medication as prescribed by your surgeon.  Continue your approved medications as instructed at time of discharge. ° °PRECAUTIONS °If you experience chest pain or shortness of breath - call 911 immediately for transfer to the hospital emergency department.  °If you develop a fever greater that 101 F,  purulent drainage from wound, increased redness or drainage from wound, foul odor from the wound/dressing, or calf pain - CONTACT YOUR SURGEON.   °                                                °FOLLOW-UP APPOINTMENTS °Make sure you keep all of your appointments after your operation with your surgeon and caregivers. You should call the office at the above phone number and make an appointment for approximately two weeks after the date of your surgery or on the date instructed by your surgeon outlined in the "After Visit Summary". ° °RANGE OF MOTION AND STRENGTHENING EXERCISES  °These exercises are designed to help you keep full movement of your hip joint. Follow your caregiver's or physical therapist's instructions. Perform all exercises about fifteen times, three times per day or as directed. Exercise both hips, even if you   have had only one joint replacement. These exercises can be done on a training (exercise) mat, on the floor, on a table or on a bed. Use whatever works the best and is most comfortable for you. Use music or television while you are exercising so that the exercises are a pleasant break in your day. This will make your life better with the exercises acting as a break in routine you can look forward to.  °Lying on your back, slowly slide your foot toward your buttocks, raising your knee up off the floor. Then slowly slide your foot back down until your leg is straight again.  °Lying on your back spread your legs as far apart as you can without causing discomfort.  °Lying on your side, raise your upper leg and foot straight up from the floor as far as is comfortable. Slowly lower the leg and repeat.  °Lying on your back, tighten up the muscle in the front of your thigh (quadriceps muscles). You can do this by keeping your leg straight and trying to raise your heel off the floor. This helps strengthen the largest muscle supporting your knee.  °Lying on your back, tighten up the muscles of your  buttocks both with the legs straight and with the knee bent at a comfortable angle while keeping your heel on the floor.  ° °IF YOU ARE TRANSFERRED TO A SKILLED REHAB FACILITY °If the patient is transferred to a skilled rehab facility following release from the hospital, a list of the current medications will be sent to the facility for the patient to continue.  When discharged from the skilled rehab facility, please have the facility set up the patient's Home Health Physical Therapy prior to being released. Also, the skilled facility will be responsible for providing the patient with their medications at time of release from the facility to include their pain medication, the muscle relaxants, and their blood thinner medication. If the patient is still at the rehab facility at time of the two week follow up appointment, the skilled rehab facility will also need to assist the patient in arranging follow up appointment in our office and any transportation needs. ° °MAKE SURE YOU:  °Understand these instructions.  °Get help right away if you are not doing well or get worse.  ° ° °Pick up stool softner and laxative for home use following surgery while on pain medications. °Do not submerge incision under water. °Please use good hand washing techniques while changing dressing each day. °May shower starting three days after surgery. °Please use a clean towel to pat the incision dry following showers. °Continue to use ice for pain and swelling after surgery. °Do not use any lotions or creams on the incision until instructed by your surgeon. ° °Take Xarelto for two and a half more weeks, then discontinue Xarelto. °Once the patient has completed the Xarelto, they may resume the 81 mg Aspirin. ° °

## 2015-06-27 NOTE — Discharge Summary (Signed)
Physician Discharge Summary   Patient ID: Corey Fuller MRN: [id number] DOB/AGE: 10-Feb-1946 69 y.o.  Admit date: 06/26/2015 Discharge date: 06/27/2015  Primary Diagnosis: Osteoarthritis of the Right hip.    Admission Diagnoses:  Past Medical History  Diagnosis Date  . Hypertension   . Kidney stone   . Enlarged prostate   . Borderline diabetes   . Hearing difficulty of both ears     can not hear high freq  . History of bronchitis     3 to 4 years ago   . Urinary frequency   . Nocturia   . GERD (gastroesophageal reflux disease)   . History of hiatal hernia   . Arthritis   . Carpal tunnel syndrome     bilat   . History of shingles     2 years ago   . Bright's disease    Discharge Diagnoses:   Principal Problem:   OA (osteoarthritis) of hip  Estimated body mass index is 36.17 kg/(m^2) as calculated from the following:   Height as of this encounter: 5' 6"  (1.676 m).   Weight as of this encounter: 101.606 kg (224 lb).  Procedure(s) (LRB): RIGHT TOTAL HIP ARTHROPLASTY ANTERIOR APPROACH (Right)   Consults: None  HPI: Corey Fuller is a 69 y.o. male who has advanced end-  Fuller arthritis of his Right hip with progressively worsening pain and  dysfunction.The patient has failed nonoperative management and presents for  total hip arthroplasty.   Laboratory Data: Admission on 06/26/2015  Component Date Value Ref Range Status  . Glucose-Capillary 06/26/2015 96  65 - 99 mg/dL Final  . Comment 1 06/26/2015 Notify RN   Final  . Glucose-Capillary 06/26/2015 98  65 - 99 mg/dL Final  . Comment 1 06/26/2015 Notify RN   Final  . Comment 2 06/26/2015 Document in Chart   Final  . Glucose-Capillary 06/26/2015 223* 65 - 99 mg/dL Final  . WBC 06/27/2015 15.7* 4.0 - 10.5 K/uL Final  . RBC 06/27/2015 4.33  4.22 - 5.81 MIL/uL Final  . Hemoglobin 06/27/2015 13.3  13.0 - 17.0 g/dL Final  . HCT 06/27/2015 39.4  39.0 - 52.0 % Final  . MCV 06/27/2015 91.0  78.0 - 100.0 fL Final  . MCH  06/27/2015 30.7  26.0 - 34.0 pg Final  . MCHC 06/27/2015 33.8  30.0 - 36.0 g/dL Final  . RDW 06/27/2015 13.2  11.5 - 15.5 % Final  . Platelets 06/27/2015 227  150 - 400 K/uL Final  . Sodium 06/27/2015 141  135 - 145 mmol/L Final  . Potassium 06/27/2015 4.2  3.5 - 5.1 mmol/L Final  . Chloride 06/27/2015 108  101 - 111 mmol/L Final  . CO2 06/27/2015 24  22 - 32 mmol/L Final  . Glucose, Bld 06/27/2015 135* 65 - 99 mg/dL Final  . BUN 06/27/2015 20  6 - 20 mg/dL Final  . Creatinine, Ser 06/27/2015 1.14  0.61 - 1.24 mg/dL Final  . Calcium 06/27/2015 9.4  8.9 - 10.3 mg/dL Final  . GFR calc non Af Amer 06/27/2015 >60  >60 mL/min Final  . GFR calc Af Amer 06/27/2015 >60  >60 mL/min Final   Comment: (NOTE) The eGFR has been calculated using the CKD EPI equation. This calculation has not been validated in all clinical situations. eGFR's persistently <60 mL/min signify possible Chronic Kidney Disease.   . Anion gap 06/27/2015 9  5 - 15 Final  . Glucose-Capillary 06/26/2015 187* 65 - 99 mg/dL Final  . Comment 1 06/26/2015  Notify RN   Final  . Glucose-Capillary 06/27/2015 114* 65 - 99 mg/dL Final  Hospital Outpatient Visit on 06/19/2015  Component Date Value Ref Range Status  . MRSA, PCR 06/19/2015 NEGATIVE  NEGATIVE Final  . Staphylococcus aureus 06/19/2015 NEGATIVE  NEGATIVE Final   Comment:        The Xpert SA Assay (FDA approved for NASAL specimens in patients over 68 years of age), is one component of a comprehensive surveillance program.  Test performance has been validated by [facility] for patients greater than or equal to 8 year old. It is not intended to diagnose infection nor to guide or monitor treatment.   Marland Kitchen aPTT 06/19/2015 32  24 - 37 seconds Final  . WBC 06/19/2015 6.6  4.0 - 10.5 K/uL Final  . RBC 06/19/2015 5.06  4.22 - 5.81 MIL/uL Final  . Hemoglobin 06/19/2015 15.7  13.0 - 17.0 g/dL Final  . HCT 06/19/2015 47.3  39.0 - 52.0 % Final  . MCV 06/19/2015 93.5  78.0 -  100.0 fL Final  . MCH 06/19/2015 31.0  26.0 - 34.0 pg Final  . MCHC 06/19/2015 33.2  30.0 - 36.0 g/dL Final  . RDW 06/19/2015 13.6  11.5 - 15.5 % Final  . Platelets 06/19/2015 235  150 - 400 K/uL Final  . Sodium 06/19/2015 141  135 - 145 mmol/L Final  . Potassium 06/19/2015 3.8  3.5 - 5.1 mmol/L Final  . Chloride 06/19/2015 103  101 - 111 mmol/L Final  . CO2 06/19/2015 28  22 - 32 mmol/L Final  . Glucose, Bld 06/19/2015 98  65 - 99 mg/dL Final  . BUN 06/19/2015 18  6 - 20 mg/dL Final  . Creatinine, Ser 06/19/2015 1.30* 0.61 - 1.24 mg/dL Final  . Calcium 06/19/2015 10.1  8.9 - 10.3 mg/dL Final  . Total Protein 06/19/2015 7.6  6.5 - 8.1 g/dL Final  . Albumin 06/19/2015 4.5  3.5 - 5.0 g/dL Final  . AST 06/19/2015 28  15 - 41 U/L Final  . ALT 06/19/2015 20  17 - 63 U/L Final  . Alkaline Phosphatase 06/19/2015 62  38 - 126 U/L Final  . Total Bilirubin 06/19/2015 0.7  0.3 - 1.2 mg/dL Final  . GFR calc non Af Amer 06/19/2015 54* >60 mL/min Final  . GFR calc Af Amer 06/19/2015 >60  >60 mL/min Final   Comment: (NOTE) The eGFR has been calculated using the CKD EPI equation. This calculation has not been validated in all clinical situations. eGFR's persistently <60 mL/min signify possible Chronic Kidney Disease.   . Anion gap 06/19/2015 10  5 - 15 Final  . Prothrombin Time 06/19/2015 14.0  11.6 - 15.2 seconds Final  . INR 06/19/2015 1.06  0.00 - 1.49 Final  . ABO/RH(D) 06/19/2015 O POS   Final  . Antibody Screen 06/19/2015 NEG   Final  . Sample Expiration 06/19/2015 06/29/2015   Final  . Color, Urine 06/19/2015 YELLOW  YELLOW Final  . APPearance 06/19/2015 CLEAR  CLEAR Final  . Specific Gravity, Urine 06/19/2015 1.020  1.005 - 1.030 Final  . pH 06/19/2015 6.5  5.0 - 8.0 Final  . Glucose, UA 06/19/2015 NEGATIVE  NEGATIVE mg/dL Final  . Hgb urine dipstick 06/19/2015 NEGATIVE  NEGATIVE Final  . Bilirubin Urine 06/19/2015 NEGATIVE  NEGATIVE Final  . Ketones, ur 06/19/2015 NEGATIVE  NEGATIVE  mg/dL Final  . Protein, ur 06/19/2015 NEGATIVE  NEGATIVE mg/dL Final  . Urobilinogen, UA 06/19/2015 0.2  0.0 - 1.0 mg/dL  Final  . Nitrite 06/19/2015 NEGATIVE  NEGATIVE Final  . Leukocytes, UA 06/19/2015 SMALL* NEGATIVE Final  . ABO/RH(D) 06/19/2015 O POS   Final  . Squamous Epithelial / LPF 06/19/2015 RARE  RARE Final  . WBC, UA 06/19/2015 11-20  <3 WBC/hpf Final  . RBC / HPF 06/19/2015 0-2  <3 RBC/hpf Final  . Bacteria, UA 06/19/2015 RARE  RARE Final  . Urine-Other 06/19/2015 MUCOUS PRESENT   Final     X-Rays:Dg Pelvis Portable  06/26/2015   CLINICAL DATA:  Status post right hip replacement  EXAM: DG C-ARM 1-60 MIN-NO REPORT; PORTABLE PELVIS 1-2 VIEWS  COMPARISON:  None.  FINDINGS: Bilateral hip replacements are seen. The right hip replacement is acute with surgical drain in place. No acute bony abnormality is seen. No gross soft tissue abnormality is noted.  IMPRESSION: Status post right hip replacement   Electronically Signed   By: Inez Catalina M.D.   On: 06/26/2015 13:59   Dg C-arm 1-60 Min-no Report  06/26/2015   CLINICAL DATA: right hip arthriitis   C-ARM 1-60 MINUTES  Fluoroscopy was utilized by the requesting physician.  No radiographic  interpretation.     EKG: Orders placed or performed in visit on 04/13/15  . EKG 12-Lead     Hospital Course: Patient was admitted to [facility] and taken to the OR and underwent the above state procedure without complications.  Patient tolerated the procedure well and was later transferred to the recovery room and then to the orthopaedic floor for postoperative care.  They were given PO and IV analgesics for pain control following their surgery.  They were given 24 hours of postoperative antibiotics of  Anti-infectives    Start     Dose/Rate Route Frequency Ordered Stop   06/26/15 1630  ceFAZolin (ANCEF) IVPB 2 g/50 mL premix     2 g 100 mL/hr over 30 Minutes Intravenous Every 6 hours 06/26/15 1401 06/26/15 2320   06/26/15 0817   ceFAZolin (ANCEF) IVPB 2 g/50 mL premix     2 g 100 mL/hr over 30 Minutes Intravenous On call to O.R. 06/26/15 1610 06/26/15 1045     and started on DVT prophylaxis in the form of Xarelto.   PT and OT were ordered for total hip protocol.  The patient was allowed to be WBAT with therapy. Discharge planning was consulted to help with postop disposition and equipment needs.  Patient had a good night on the evening of surgery.  They started to get up OOB with therapy on day one.  Hemovac drain was pulled without difficulty.   Patient was seen in rounds on day one and felt that if they did well with therapy and met their goals that they would be ready to go home later that same day on POD 1.  Diet - Cardiac diet and Diabetic diet Follow up - in 2 weeks Activity - WBAT Disposition - Home Condition Upon Discharge - Improving D/C Meds - See DC Summary DVT Prophylaxis - Xarelto  Discharge Instructions    Call MD / Call 911    Complete by:  As directed   If you experience chest pain or shortness of breath, CALL 911 and be transported to the hospital emergency room.  If you develope a fever above 101 F, pus (white drainage) or increased drainage or redness at the wound, or calf pain, call your surgeon's office.     Change dressing    Complete by:  As directed  You may change your dressing dressing daily with sterile 4 x 4 inch gauze dressing and paper tape.  Do not submerge the incision under water.     Constipation Prevention    Complete by:  As directed   Drink plenty of fluids.  Prune juice may be helpful.  You may use a stool softener, such as Colace (over the counter) 100 mg twice a day.  Use MiraLax (over the counter) for constipation as needed.     Diet - low sodium heart healthy    Complete by:  As directed      Diet Carb Modified    Complete by:  As directed      Discharge instructions    Complete by:  As directed   Pick up stool softner and laxative for home use following surgery while  on pain medications. Do not submerge incision under water. May remove surgical dressing tomorrow (Wednesday 06/28/2015) and apply a dry gauze dressing to incision daily. Please use good hand washing techniques while changing dressing each day. May shower starting three days after surgery starting Thursday 06/29/2015. Please use a clean towel to pat the incision dry following showers. Continue to use ice for pain and swelling after surgery. Do not use any lotions or creams on the incision until instructed by your surgeon.  Take Xarelto for two and a half more weeks, then discontinue Xarelto. Once the patient has completed the Xarelto, they may resume the 81 mg Aspirin.  Postoperative Constipation Protocol  Constipation - defined medically as fewer than three stools per week and severe constipation as less than one stool per week.  One of the most common issues patients have following surgery is constipation.  Even if you have a regular bowel pattern at home, your normal regimen is likely to be disrupted due to multiple reasons following surgery.  Combination of anesthesia, postoperative narcotics, change in appetite and fluid intake all can affect your bowels.  In order to avoid complications following surgery, here are some recommendations in order to help you during your recovery period.  Colace (docusate) - Pick up an over-the-counter form of Colace or another stool softener and take twice a day as long as you are requiring postoperative pain medications.  Take with a full glass of water daily.  If you experience loose stools or diarrhea, hold the colace until you stool forms back up.  If your symptoms do not get better within 1 week or if they get worse, check with your doctor.  Dulcolax (bisacodyl) - Pick up over-the-counter and take as directed by the product packaging as needed to assist with the movement of your bowels.  Take with a full glass of water.  Use this product as needed if not  relieved by Colace only.   MiraLax (polyethylene glycol) - Pick up over-the-counter to have on hand.  MiraLax is a solution that will increase the amount of water in your bowels to assist with bowel movements.  Take as directed and can mix with a glass of water, juice, soda, coffee, or tea.  Take if you go more than two days without a movement. Do not use MiraLax more than once per day. Call your doctor if you are still constipated or irregular after using this medication for 7 days in a row.  If you continue to have problems with postoperative constipation, please contact the office for further assistance and recommendations.  If you experience "the worst abdominal pain ever" or develop nausea or  vomiting, please contact the office immediatly for further recommendations for treatment.     Do not sit on low chairs, stoools or toilet seats, as it may be difficult to get up from low surfaces    Complete by:  As directed      Driving restrictions    Complete by:  As directed   No driving until released by the physician.     Increase activity slowly as tolerated    Complete by:  As directed      Lifting restrictions    Complete by:  As directed   No lifting until released by the physician.     Patient may shower    Complete by:  As directed   You may shower without a dressing once there is no drainage.  Do not wash over the wound.  If drainage remains, do not shower until drainage stops.     TED hose    Complete by:  As directed   Use stockings (TED hose) for 3 weeks on both leg(s).  You may remove them at night for sleeping.     Weight bearing as tolerated    Complete by:  As directed   Laterality:  right  Extremity:  Lower            Medication List    STOP taking these medications        aspirin 81 MG tablet     Garlic 2774 MG Caps     multivitamin tablet     naproxen 375 MG tablet  Commonly known as:  NAPROSYN     oxyCODONE-acetaminophen 5-325 MG per tablet  Commonly  known as:  PERCOCET/ROXICET     vitamin E 400 UNIT capsule      TAKE these medications        allopurinol 300 MG tablet  Commonly known as:  ZYLOPRIM  Take 300 mg by mouth daily.     amLODipine 5 MG tablet  Commonly known as:  NORVASC  Take 5 mg by mouth every morning.     dutasteride 0.5 MG capsule  Commonly known as:  AVODART  Take 0.5 mg by mouth daily.     fenofibrate 48 MG tablet  Commonly known as:  TRICOR  Take 48 mg by mouth daily.     fluticasone 27.5 MCG/SPRAY nasal spray  Commonly known as:  VERAMYST  Place 2 sprays into the nose at bedtime.     losartan-hydrochlorothiazide 100-12.5 MG per tablet  Commonly known as:  HYZAAR  Take 1 tablet by mouth every morning.     metFORMIN 500 MG tablet  Commonly known as:  GLUCOPHAGE  Take by mouth daily with breakfast.     methocarbamol 500 MG tablet  Commonly known as:  ROBAXIN  Take 1 tablet (500 mg total) by mouth every 6 (six) hours as needed for muscle spasms.     omeprazole 40 MG capsule  Commonly known as:  PRILOSEC  Take 40 mg by mouth daily.     oxyCODONE 5 MG immediate release tablet  Commonly known as:  Oxy IR/ROXICODONE  Take 1-2 tablets (5-10 mg total) by mouth every 3 (three) hours as needed for moderate pain or severe pain.     rivaroxaban 10 MG Tabs tablet  Commonly known as:  XARELTO  Take 1 tablet (10 mg total) by mouth daily with breakfast. Take Xarelto for two and a half more weeks, then discontinue Xarelto. Once the patient has completed the Xarelto, they  may resume the 81 mg Aspirin.     rosuvastatin 40 MG tablet  Commonly known as:  CRESTOR  Take 40 mg by mouth every evening.     tamsulosin 0.4 MG Caps capsule  Commonly known as:  FLOMAX  Take 0.4 mg by mouth every evening.     traMADol 50 MG tablet  Commonly known as:  ULTRAM  Take 1-2 tablets (50-100 mg total) by mouth every 6 (six) hours as needed (mild pain).           Follow-up Information    Follow up with Gearlean Alf, MD. Schedule an appointment as soon as possible for a visit on 07/11/2015.   Specialty:  Orthopedic Surgery   Why:  Call office at [phone] to setup appointment with Dr. Wynelle Link on Tuesday 07/11/2015.   Contact information:   [address] [place] [address] [phone]       Signed: Arlee Muslim, PA-C Orthopaedic Surgery 06/27/2015, 9:01 AM

## 2015-06-27 NOTE — Evaluation (Signed)
Occupational Therapy Evaluation Patient Details Name: Corey Fuller MRN: [id number] DOB: [birth year] Today's Date: 06/27/2015    History of Present Illness R DA THA   Clinical Impression   Patient admitted with above. Patient independent > mod I PTA. Patient currently functioning at a supervision level for functional mobility and requires up to min assist for ADLs; however pt reports he has AE he uses at home for LB ADLs. D/C from acute OT services and no additional follow-up OT needs at this time. All appropriate education provided to patient. Please re-order OT if needed.    Pt reports he plans to discharge to his daughters house where her and his wife can assist him prn.    Follow Up Recommendations  No OT follow up;Supervision - Intermittent    Equipment Recommendations  3 in 1 bedside comode    Recommendations for Other Services  None at this time    Precautions / Restrictions Precautions Precautions: Fall Precaution Comments: direct anterior approach, no hip precautions  Restrictions Weight Bearing Restrictions: Yes Other Position/Activity Restrictions: WBAT    Mobility Bed Mobility Overal bed mobility: Needs Assistance Bed Mobility: Supine to Sit     Supine to sit: Min assist     General bed mobility comments: Pt found seated in recliner upon OT entering/exiting room, see PT note  Transfers Overall transfer level: Needs assistance Equipment used: Rolling walker (2 wheeled) Transfers: Sit to/from Stand Sit to Stand: Supervision General transfer comment: Supervision for safety, no cues needed.     Balance Overall balance assessment: Needs assistance Sitting-balance support: No upper extremity supported;Feet supported Sitting balance-Leahy Scale: Good     Standing balance support: Bilateral upper extremity supported;During functional activity Standing balance-Leahy Scale: Fair Standing balance comment: using RW    ADL Overall ADL's : Needs  assistance/impaired General ADL Comments: Pt needs assistance with LB ADLs, pt reports he has AE at home from his previous surgery. Pt stood from recliner and ambulated into BR for toilet transfer using 3-in-1 over toilet seat and shower transferm (simulated like tub/shower). Encouraged pt to step over tub backwards using strong leg, then step out of tub frontwards using weak leg. Encouraged pt to have wife or daughter present for shower transfers and showers and encouraged pt to perform simulated "dry run" transfers to ensure safety.     Pertinent Vitals/Pain Pain Assessment: No/denies pain Pain Score: 0-No pain Pain Intervention(s): Premedicated before session     Hand Dominance Right   Extremity/Trunk Assessment Upper Extremity Assessment Upper Extremity Assessment: Overall WFL for tasks assessed   Lower Extremity Assessment Lower Extremity Assessment: Defer to PT evaluation   Cervical / Trunk Assessment Cervical / Trunk Assessment: Kyphotic   Communication Communication Communication: No difficulties   Cognition Arousal/Alertness: Awake/alert Behavior During Therapy: WFL for tasks assessed/performed Overall Cognitive Status: Within Functional Limits for tasks assessed              Home Living Family/patient expects to be discharged to:: Private residence Living Arrangements: Spouse/significant other Available Help at Discharge: Family;Available 24 hours/day Type of Home: House Home Access: Stairs to enter [employer] of Steps: 2   Home Layout: One level     Bathroom Shower/Tub: Tub/shower unit;Curtain   Firefighter: Standard     Home Equipment: Environmental consultant - 2 wheels;Cane - single point;Adaptive equipment Adaptive Equipment: Reacher;Sock aid Additional Comments: above is based on patient's daughters house, pt reports him and his wife plan to live there post acute d/c  Prior Functioning/Environment Level of Independence: Independent with  assistive device(s);Independent  Comments: cane occasionally    OT Diagnosis: Generalized weakness   OT Problem List:  n/a, no acute OT needs identified    OT Treatment/Interventions:   n/a, no acute OT needs identified    OT Goals(Current goals can be found in the care plan section) Acute Rehab OT Goals Patient Stated Goal: go home today! OT Goal Formulation: All assessment and education complete, DC therapy  OT Frequency:   n/a, no acute OT needs identified    Barriers to D/C:  None known at this time    End of Session Equipment Utilized During Treatment: Rolling walker  Activity Tolerance:   Patient left: in chair;with call bell/phone within reach   Time: 1047-1102 OT Time Calculation (min): 15 min Charges:  OT General Charges $OT Visit: 1 Procedure OT Evaluation $Initial OT Evaluation Tier I: 1 Procedure  Chou Busler , MS, OTR/L, CLT Pager: [phone]  06/27/2015, 11:14 AM

## 2015-06-28 DIAGNOSIS — E669 Obesity, unspecified: Secondary | ICD-10-CM | POA: Diagnosis not present

## 2015-06-28 DIAGNOSIS — M1990 Unspecified osteoarthritis, unspecified site: Secondary | ICD-10-CM | POA: Diagnosis not present

## 2015-06-28 DIAGNOSIS — I10 Essential (primary) hypertension: Secondary | ICD-10-CM | POA: Diagnosis not present

## 2015-06-28 DIAGNOSIS — H9193 Unspecified hearing loss, bilateral: Secondary | ICD-10-CM | POA: Diagnosis not present

## 2015-06-28 DIAGNOSIS — Z471 Aftercare following joint replacement surgery: Secondary | ICD-10-CM | POA: Diagnosis not present

## 2015-06-28 DIAGNOSIS — Z96643 Presence of artificial hip joint, bilateral: Secondary | ICD-10-CM | POA: Diagnosis not present

## 2015-06-29 DIAGNOSIS — H9193 Unspecified hearing loss, bilateral: Secondary | ICD-10-CM | POA: Diagnosis not present

## 2015-06-29 DIAGNOSIS — Z96643 Presence of artificial hip joint, bilateral: Secondary | ICD-10-CM | POA: Diagnosis not present

## 2015-06-29 DIAGNOSIS — E669 Obesity, unspecified: Secondary | ICD-10-CM | POA: Diagnosis not present

## 2015-06-29 DIAGNOSIS — Z471 Aftercare following joint replacement surgery: Secondary | ICD-10-CM | POA: Diagnosis not present

## 2015-06-29 DIAGNOSIS — I10 Essential (primary) hypertension: Secondary | ICD-10-CM | POA: Diagnosis not present

## 2015-06-29 DIAGNOSIS — M1990 Unspecified osteoarthritis, unspecified site: Secondary | ICD-10-CM | POA: Diagnosis not present

## 2015-07-04 DIAGNOSIS — I10 Essential (primary) hypertension: Secondary | ICD-10-CM | POA: Diagnosis not present

## 2015-07-04 DIAGNOSIS — E669 Obesity, unspecified: Secondary | ICD-10-CM | POA: Diagnosis not present

## 2015-07-04 DIAGNOSIS — M1990 Unspecified osteoarthritis, unspecified site: Secondary | ICD-10-CM | POA: Diagnosis not present

## 2015-07-04 DIAGNOSIS — Z96643 Presence of artificial hip joint, bilateral: Secondary | ICD-10-CM | POA: Diagnosis not present

## 2015-07-04 DIAGNOSIS — H9193 Unspecified hearing loss, bilateral: Secondary | ICD-10-CM | POA: Diagnosis not present

## 2015-07-04 DIAGNOSIS — Z471 Aftercare following joint replacement surgery: Secondary | ICD-10-CM | POA: Diagnosis not present

## 2015-07-06 DIAGNOSIS — Z96643 Presence of artificial hip joint, bilateral: Secondary | ICD-10-CM | POA: Diagnosis not present

## 2015-07-06 DIAGNOSIS — H9193 Unspecified hearing loss, bilateral: Secondary | ICD-10-CM | POA: Diagnosis not present

## 2015-07-06 DIAGNOSIS — Z471 Aftercare following joint replacement surgery: Secondary | ICD-10-CM | POA: Diagnosis not present

## 2015-07-06 DIAGNOSIS — M1990 Unspecified osteoarthritis, unspecified site: Secondary | ICD-10-CM | POA: Diagnosis not present

## 2015-07-06 DIAGNOSIS — I10 Essential (primary) hypertension: Secondary | ICD-10-CM | POA: Diagnosis not present

## 2015-07-06 DIAGNOSIS — E669 Obesity, unspecified: Secondary | ICD-10-CM | POA: Diagnosis not present

## 2015-07-10 DIAGNOSIS — M1990 Unspecified osteoarthritis, unspecified site: Secondary | ICD-10-CM | POA: Diagnosis not present

## 2015-07-10 DIAGNOSIS — E669 Obesity, unspecified: Secondary | ICD-10-CM | POA: Diagnosis not present

## 2015-07-10 DIAGNOSIS — Z96643 Presence of artificial hip joint, bilateral: Secondary | ICD-10-CM | POA: Diagnosis not present

## 2015-07-10 DIAGNOSIS — Z471 Aftercare following joint replacement surgery: Secondary | ICD-10-CM | POA: Diagnosis not present

## 2015-07-10 DIAGNOSIS — H9193 Unspecified hearing loss, bilateral: Secondary | ICD-10-CM | POA: Diagnosis not present

## 2015-07-10 DIAGNOSIS — I10 Essential (primary) hypertension: Secondary | ICD-10-CM | POA: Diagnosis not present

## 2015-07-11 DIAGNOSIS — Z471 Aftercare following joint replacement surgery: Secondary | ICD-10-CM | POA: Diagnosis not present

## 2015-07-11 DIAGNOSIS — Z96641 Presence of right artificial hip joint: Secondary | ICD-10-CM | POA: Diagnosis not present

## 2015-07-31 DIAGNOSIS — M109 Gout, unspecified: Secondary | ICD-10-CM | POA: Diagnosis not present

## 2015-07-31 DIAGNOSIS — E119 Type 2 diabetes mellitus without complications: Secondary | ICD-10-CM | POA: Diagnosis not present

## 2015-07-31 DIAGNOSIS — E785 Hyperlipidemia, unspecified: Secondary | ICD-10-CM | POA: Diagnosis not present

## 2015-08-01 DIAGNOSIS — Z471 Aftercare following joint replacement surgery: Secondary | ICD-10-CM | POA: Diagnosis not present

## 2015-08-01 DIAGNOSIS — Z96641 Presence of right artificial hip joint: Secondary | ICD-10-CM | POA: Diagnosis not present

## 2015-08-09 DIAGNOSIS — M109 Gout, unspecified: Secondary | ICD-10-CM | POA: Diagnosis not present

## 2015-08-09 DIAGNOSIS — E785 Hyperlipidemia, unspecified: Secondary | ICD-10-CM | POA: Diagnosis not present

## 2015-08-09 DIAGNOSIS — Z96642 Presence of left artificial hip joint: Secondary | ICD-10-CM | POA: Diagnosis not present

## 2015-08-09 DIAGNOSIS — E1165 Type 2 diabetes mellitus with hyperglycemia: Secondary | ICD-10-CM | POA: Diagnosis not present

## 2015-08-09 DIAGNOSIS — N400 Enlarged prostate without lower urinary tract symptoms: Secondary | ICD-10-CM | POA: Diagnosis not present

## 2015-08-09 DIAGNOSIS — K219 Gastro-esophageal reflux disease without esophagitis: Secondary | ICD-10-CM | POA: Diagnosis not present

## 2015-08-09 DIAGNOSIS — Z23 Encounter for immunization: Secondary | ICD-10-CM | POA: Diagnosis not present

## 2015-08-09 DIAGNOSIS — E119 Type 2 diabetes mellitus without complications: Secondary | ICD-10-CM | POA: Diagnosis not present

## 2015-08-09 DIAGNOSIS — I10 Essential (primary) hypertension: Secondary | ICD-10-CM | POA: Diagnosis not present

## 2015-08-09 DIAGNOSIS — N200 Calculus of kidney: Secondary | ICD-10-CM | POA: Diagnosis not present

## 2015-09-12 DIAGNOSIS — Z96641 Presence of right artificial hip joint: Secondary | ICD-10-CM | POA: Diagnosis not present

## 2015-09-12 DIAGNOSIS — Z471 Aftercare following joint replacement surgery: Secondary | ICD-10-CM | POA: Diagnosis not present

## 2015-12-21 DIAGNOSIS — Z7984 Long term (current) use of oral hypoglycemic drugs: Secondary | ICD-10-CM | POA: Diagnosis not present

## 2015-12-21 DIAGNOSIS — K219 Gastro-esophageal reflux disease without esophagitis: Secondary | ICD-10-CM | POA: Diagnosis not present

## 2015-12-21 DIAGNOSIS — M109 Gout, unspecified: Secondary | ICD-10-CM | POA: Diagnosis not present

## 2015-12-21 DIAGNOSIS — I10 Essential (primary) hypertension: Secondary | ICD-10-CM | POA: Diagnosis not present

## 2015-12-21 DIAGNOSIS — E119 Type 2 diabetes mellitus without complications: Secondary | ICD-10-CM | POA: Diagnosis not present

## 2015-12-21 DIAGNOSIS — Z96642 Presence of left artificial hip joint: Secondary | ICD-10-CM | POA: Diagnosis not present

## 2015-12-21 DIAGNOSIS — E785 Hyperlipidemia, unspecified: Secondary | ICD-10-CM | POA: Diagnosis not present

## 2015-12-21 DIAGNOSIS — N400 Enlarged prostate without lower urinary tract symptoms: Secondary | ICD-10-CM | POA: Diagnosis not present

## 2015-12-21 DIAGNOSIS — N200 Calculus of kidney: Secondary | ICD-10-CM | POA: Diagnosis not present

## 2016-04-18 DIAGNOSIS — E119 Type 2 diabetes mellitus without complications: Secondary | ICD-10-CM | POA: Diagnosis not present

## 2016-04-18 DIAGNOSIS — Z7984 Long term (current) use of oral hypoglycemic drugs: Secondary | ICD-10-CM | POA: Diagnosis not present

## 2016-04-18 DIAGNOSIS — N400 Enlarged prostate without lower urinary tract symptoms: Secondary | ICD-10-CM | POA: Diagnosis not present

## 2016-04-18 DIAGNOSIS — E785 Hyperlipidemia, unspecified: Secondary | ICD-10-CM | POA: Diagnosis not present

## 2016-04-18 DIAGNOSIS — M109 Gout, unspecified: Secondary | ICD-10-CM | POA: Diagnosis not present

## 2016-04-18 DIAGNOSIS — N200 Calculus of kidney: Secondary | ICD-10-CM | POA: Diagnosis not present

## 2016-04-18 DIAGNOSIS — I10 Essential (primary) hypertension: Secondary | ICD-10-CM | POA: Diagnosis not present

## 2016-04-18 DIAGNOSIS — Z96642 Presence of left artificial hip joint: Secondary | ICD-10-CM | POA: Diagnosis not present

## 2016-04-18 DIAGNOSIS — K219 Gastro-esophageal reflux disease without esophagitis: Secondary | ICD-10-CM | POA: Diagnosis not present

## 2016-04-23 DIAGNOSIS — R748 Abnormal levels of other serum enzymes: Secondary | ICD-10-CM | POA: Diagnosis not present

## 2016-05-21 ENCOUNTER — Telehealth: Payer: Self-pay | Admitting: Internal Medicine

## 2016-05-21 NOTE — Telephone Encounter (Signed)
New message    Pt daughter Darl PikesSusan verbalized that she is calling to see if her father has to come in for a 1 year follow up.  She said he is not under the care of a cardiologist and that he only was seen in out practice because he needed surgical clearance.   Pt daughter is asking if this is true and if he REALLY needs to come in and be seen.

## 2016-05-21 NOTE — Telephone Encounter (Signed)
Pt was seen for surgical clearance 1 year ago  I did not think he needed routine f/u in cardiology

## 2016-05-21 NOTE — Telephone Encounter (Signed)
Left message that patient does not need cardiology follow up.

## 2016-05-21 NOTE — Telephone Encounter (Signed)
Pt was seen for clearance prior to hip surgery by Dr. Tenny Crawoss.  Routing to her to decide if patient needs to return to clinic as follow up.

## 2016-05-28 IMAGING — DX DG PORTABLE PELVIS
1 series · 1 of 1 positions shown · non-contrast
Comparison: None.

CLINICAL DATA: Status post right hip replacement

EXAM:
DG C-ARM 1-60 MIN-NO REPORT; PORTABLE PELVIS 1-2 VIEWS

[pelvis ap]
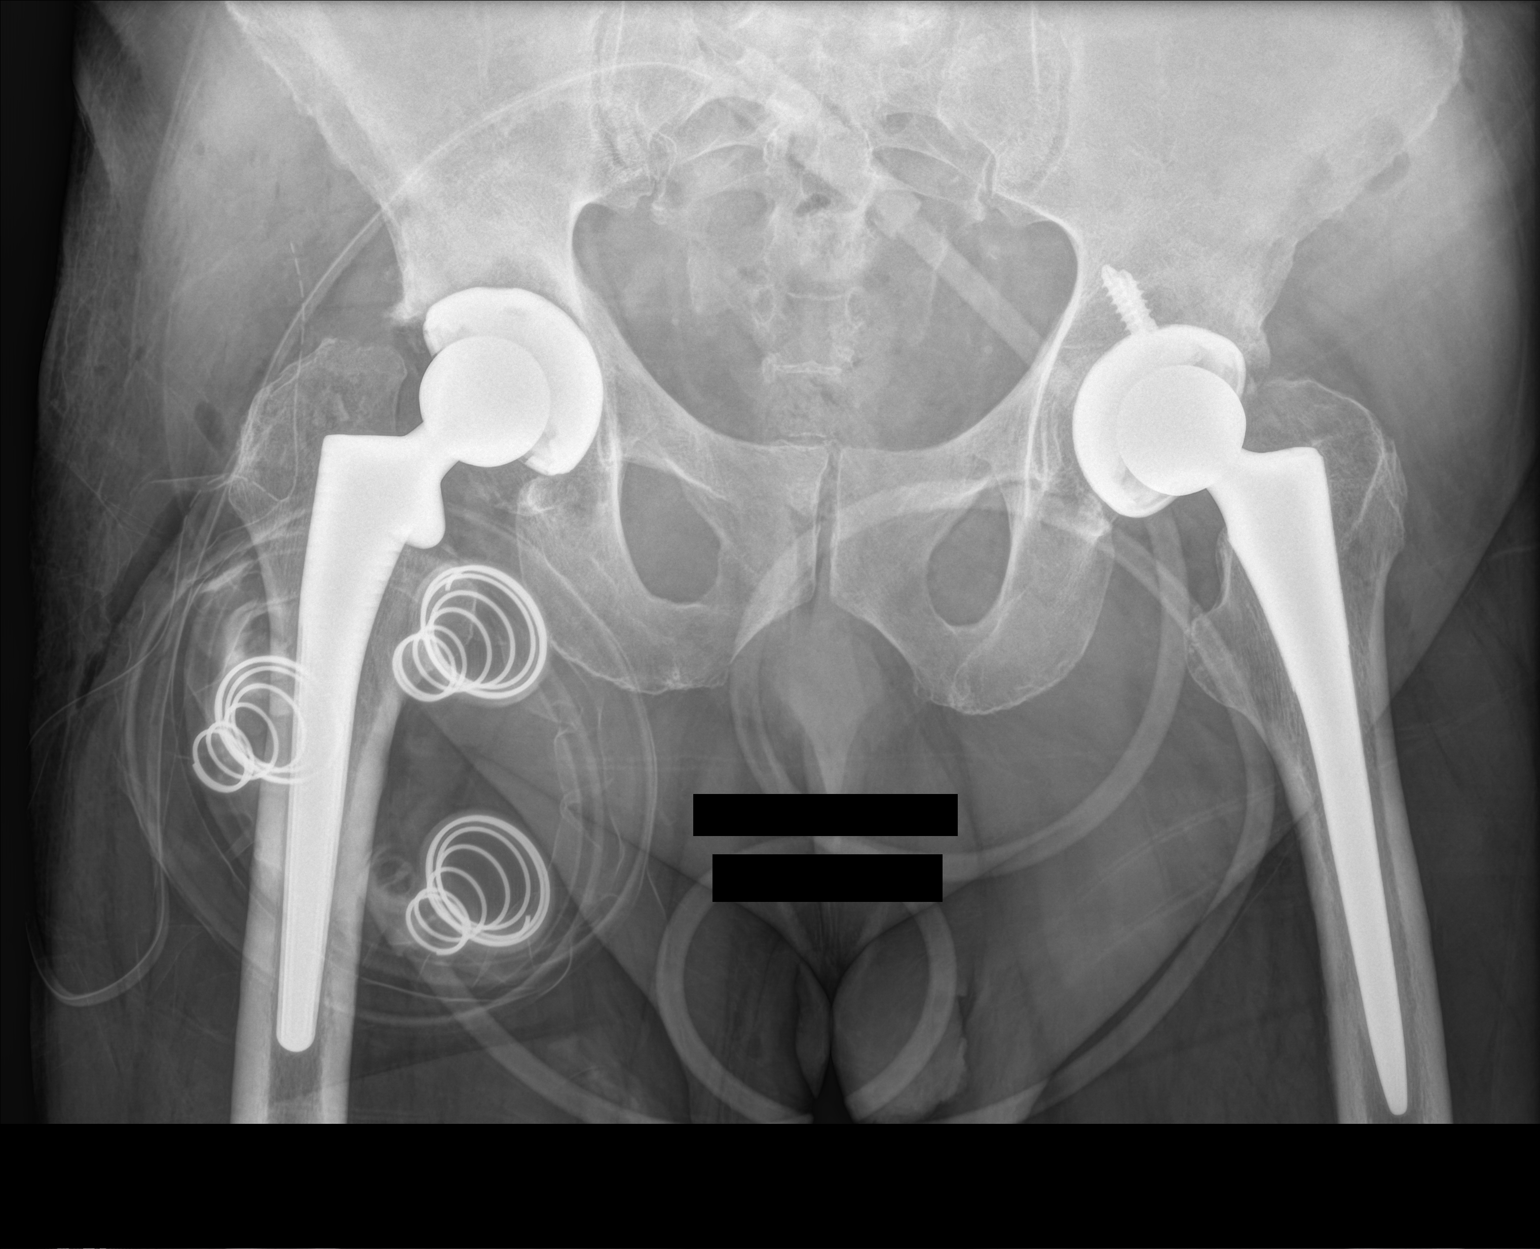

[1 of 1 positions shown; findings below may reference images not displayed]

FINDINGS: Bilateral hip replacements are seen. The right hip replacement is
acute with surgical drain in place. No acute bony abnormality is
seen. No gross soft tissue abnormality is noted.
IMPRESSION: Status post right hip replacement

## 2016-06-27 DIAGNOSIS — Z471 Aftercare following joint replacement surgery: Secondary | ICD-10-CM | POA: Diagnosis not present

## 2016-06-27 DIAGNOSIS — Z96641 Presence of right artificial hip joint: Secondary | ICD-10-CM | POA: Diagnosis not present

## 2016-08-19 DIAGNOSIS — M109 Gout, unspecified: Secondary | ICD-10-CM | POA: Diagnosis not present

## 2016-08-19 DIAGNOSIS — N400 Enlarged prostate without lower urinary tract symptoms: Secondary | ICD-10-CM | POA: Diagnosis not present

## 2016-08-19 DIAGNOSIS — I10 Essential (primary) hypertension: Secondary | ICD-10-CM | POA: Diagnosis not present

## 2016-08-19 DIAGNOSIS — Z96642 Presence of left artificial hip joint: Secondary | ICD-10-CM | POA: Diagnosis not present

## 2016-08-19 DIAGNOSIS — Z7984 Long term (current) use of oral hypoglycemic drugs: Secondary | ICD-10-CM | POA: Diagnosis not present

## 2016-08-19 DIAGNOSIS — N200 Calculus of kidney: Secondary | ICD-10-CM | POA: Diagnosis not present

## 2016-08-19 DIAGNOSIS — R748 Abnormal levels of other serum enzymes: Secondary | ICD-10-CM | POA: Diagnosis not present

## 2016-08-19 DIAGNOSIS — K219 Gastro-esophageal reflux disease without esophagitis: Secondary | ICD-10-CM | POA: Diagnosis not present

## 2016-08-19 DIAGNOSIS — E119 Type 2 diabetes mellitus without complications: Secondary | ICD-10-CM | POA: Diagnosis not present

## 2016-08-19 DIAGNOSIS — E785 Hyperlipidemia, unspecified: Secondary | ICD-10-CM | POA: Diagnosis not present

## 2016-11-28 ENCOUNTER — Other Ambulatory Visit: Payer: Self-pay | Admitting: *Deleted
